# Patient Record
Sex: Male | Born: 1985 | Race: White | Hispanic: No | Smoking: Never smoker
Health system: Southern US, Community
[De-identification: ages and names within clinical notes are randomized; demographics above are authoritative.]

## PROBLEM LIST (undated history)

## (undated) DIAGNOSIS — N481 Balanitis: Secondary | ICD-10-CM

## (undated) DIAGNOSIS — T7840XA Allergy, unspecified, initial encounter: Secondary | ICD-10-CM

## (undated) DIAGNOSIS — T148XXA Other injury of unspecified body region, initial encounter: Secondary | ICD-10-CM

## (undated) DIAGNOSIS — J45909 Unspecified asthma, uncomplicated: Secondary | ICD-10-CM

## (undated) HISTORY — DX: Allergy, unspecified, initial encounter: T78.40XA

## (undated) HISTORY — DX: Other injury of unspecified body region, initial encounter: T14.8XXA

## (undated) HISTORY — DX: Balanitis: N48.1

## (undated) HISTORY — PX: TONSILLECTOMY: SUR1361

## (undated) HISTORY — DX: Unspecified asthma, uncomplicated: J45.909

---

## 2007-01-09 ENCOUNTER — Emergency Department (HOSPITAL_COMMUNITY): Admission: EM | Admit: 2007-01-09 | Discharge: 2007-01-09 | Payer: Self-pay | Admitting: Emergency Medicine

## 2012-02-25 ENCOUNTER — Ambulatory Visit (INDEPENDENT_AMBULATORY_CARE_PROVIDER_SITE_OTHER): Payer: PRIVATE HEALTH INSURANCE | Admitting: Family Medicine

## 2012-02-25 VITALS — BP 121/74 | HR 59 | Temp 99.0°F | Resp 16 | Ht 68.5 in | Wt 176.2 lb

## 2012-02-25 DIAGNOSIS — R197 Diarrhea, unspecified: Secondary | ICD-10-CM

## 2012-02-25 MED ORDER — METRONIDAZOLE 250 MG PO TABS: 250.0000 mg | ORAL_TABLET | Freq: Three times a day (TID) | ORAL | Status: AC

## 2012-02-25 NOTE — Progress Notes (Signed)
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 The diuresis as you have cramps. He has no blood in the stool and no fever. He's tried Pepto-Bismol without success.  Objective: HEENT-unremarkable  Chest: Clear her graft heart: Regular no murmur  Abdomen: Hyperactive bowel sounds, no HSM, no masses, nontender  Skin: No jaundice  Assessment: Community-acquired diarrhea  Plan: Flagyl 250 twice a day x5, culturelle twice a day x2 weeks

## 2012-02-25 NOTE — Patient Instructions (Signed)
Diarrhea Infections caused by germs (bacterial) or a virus commonly cause diarrhea. Your caregiver has determined that with time, rest and fluids, the diarrhea should improve. In general, eat normally while drinking more water than usual. Although water may prevent dehydration, it does not contain salt and minerals (electrolytes). Broths, weak tea without caffeine and oral rehydration solutions (ORS) replace fluids and electrolytes. Small amounts of fluids should be taken frequently. Large amounts at one time may not be tolerated. Plain water may be harmful in infants and the elderly. Oral rehydrating solutions (ORS) are available at pharmacies and grocery stores. ORS replace water and important electrolytes in proper proportions. Sports drinks are not as effective as ORS and may be harmful due to sugars worsening diarrhea.  ORS is especially recommended for use in children with diarrhea. As a general guideline for children, replace any new fluid losses from diarrhea and/or vomiting with ORS as follows:   If your child weighs 22 pounds or under (10 kg or less), give 60-120 mL ( -  cup or 2 - 4 ounces) of ORS for each episode of diarrheal stool or vomiting episode.   If your child weighs more than 22 pounds (more than 10 kgs), give 120-240 mL ( - 1 cup or 4 - 8 ounces) of ORS for each diarrheal stool or episode of vomiting.   While correcting for dehydration, children should eat normally. However, foods high in sugar should be avoided because this may worsen diarrhea. Large amounts of carbonated soft drinks, juice, gelatin desserts and other highly sugared drinks should be avoided.   After correction of dehydration, other liquids that are appealing to the child may be added. Children should drink small amounts of fluids frequently and fluids should be increased as tolerated. Children should drink enough fluids to keep urine clear or pale yellow.   Adults should eat normally while drinking more fluids  than usual. Drink small amounts of fluids frequently and increase as tolerated. Drink enough fluids to keep urine clear or pale yellow. Broths, weak decaffeinated tea, lemon lime soft drinks (allowed to go flat) and ORS replace fluids and electrolytes.   Avoid:   Carbonated drinks.   Juice.   Extremely hot or cold fluids.   Caffeine drinks.   Fatty, greasy foods.   Alcohol.   Tobacco.   Too much intake of anything at one time.   Gelatin desserts.   Probiotics are active cultures of beneficial bacteria. They may lessen the amount and number of diarrheal stools in adults. Probiotics can be found in yogurt with active cultures and in supplements.   Wash hands well to avoid spreading bacteria and virus.   Anti-diarrheal medications are not recommended for infants and children.   Only take over-the-counter or prescription medicines for pain, discomfort or fever as directed by your caregiver. Do not give aspirin to children because it may cause Reye's Syndrome.   For adults, ask your caregiver if you should continue all prescribed and over-the-counter medicines.   If your caregiver has given you a follow-up appointment, it is very important to keep that appointment. Not keeping the appointment could result in a chronic or permanent injury, and disability. If there is any problem keeping the appointment, you must call back to this facility for assistance.  SEEK IMMEDIATE MEDICAL CARE IF:   You or your child is unable to keep fluids down or other symptoms or problems become worse in spite of treatment.   Vomiting or diarrhea develops and becomes persistent.     There is vomiting of blood or bile (green material).   There is blood in the stool or the stools are black and tarry.   There is no urine output in 6-8 hours or there is only a small amount of very dark urine.   Abdominal pain develops, increases or localizes.   You have a fever.   Your baby is older than 3 months with a  rectal temperature of 102 F (38.9 C) or higher.   Your baby is 3 months old or younger with a rectal temperature of 100.4 F (38 C) or higher.   You or your child develops excessive weakness, dizziness, fainting or extreme thirst.   You or your child develops a rash, stiff neck, severe headache or become irritable or sleepy and difficult to awaken.  MAKE SURE YOU:   Understand these instructions.   Will watch your condition.   Will get help right away if you are not doing well or get worse.  Document Released: 11/02/2002 Document Revised: 11/01/2011 Document Reviewed: 09/19/2009 [employer] Patient Information 2012 [employer]. 

## 2013-02-20 ENCOUNTER — Ambulatory Visit (INDEPENDENT_AMBULATORY_CARE_PROVIDER_SITE_OTHER): Payer: BC Managed Care – PPO | Admitting: Internal Medicine

## 2013-02-20 ENCOUNTER — Encounter: Payer: Self-pay | Admitting: Internal Medicine

## 2013-02-20 VITALS — BP 110/72 | HR 65 | Temp 98.0°F | Ht 69.0 in | Wt 167.0 lb

## 2013-02-20 DIAGNOSIS — N481 Balanitis: Secondary | ICD-10-CM | POA: Insufficient documentation

## 2013-02-20 DIAGNOSIS — R21 Rash and other nonspecific skin eruption: Secondary | ICD-10-CM

## 2013-02-20 DIAGNOSIS — J45909 Unspecified asthma, uncomplicated: Secondary | ICD-10-CM

## 2013-02-20 HISTORY — DX: Balanitis: N48.1

## 2013-02-20 MED ORDER — NYSTATIN-TRIAMCINOLONE 100000-0.1 UNIT/GM-% EX CREA: TOPICAL_CREAM | Freq: Three times a day (TID) | CUTANEOUS | Status: DC

## 2013-02-20 MED ORDER — BECLOMETHASONE DIPROPIONATE 80 MCG/ACT IN AERS: 2.0000 | INHALATION_SPRAY | Freq: Two times a day (BID) | RESPIRATORY_TRACT | Status: DC

## 2013-02-20 NOTE — Patient Instructions (Addendum)
Avoid pets in your roow, avoid vacuuming  your room Next visit 3 months, please make an appointment

## 2013-02-20 NOTE — Assessment & Plan Note (Signed)
Rash likely fungal, see prescription

## 2013-02-20 NOTE — Assessment & Plan Note (Addendum)
Needs better control, see history of present illness Plan: Qvair Pet avoidance  Recheck on 4 months  Cont albuterol Consider PPIs

## 2013-02-20 NOTE — Progress Notes (Signed)
  Subjective:    Patient ID: Craig Thomas, male    DOB: [birth year], 27 y.o.   MRN: [id number]  HPI New patient,  Had a severe episode of asthma 07-2012, after that he decided to start running and lose weight which usually help his asthma. Currently doing better, still feeling short of breath and chest tightness in the mornings consequently takes albuterol every morning with good relief of symptoms. He also uses albuterol before running. Another asthma trigger is pet exposures, he does have a dog at home. Has a rash at the right leg for 1 month, area is itching but not bleeding or hurting.  Past Medical History  Diagnosis Date  . Asthma   . Allergic     pet and enviromental  . 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 Tonsillectomy      History   Social History  . Marital Status: Single    Spouse Name: N/A    Number of Children: 0  . Years of Education: N/A   Occupational History  . law student    Social History Main Topics  . Smoking status: Never Smoker   . Smokeless tobacco: Never Used  . Alcohol Use: Yes     Comment: socially   . Drug Use: No  . Sexually Active: Not on file   Other Topics Concern  . Not on file   Social History Narrative  . No narrative on file   Family History  Problem Relation Age of Onset  . Adopted: Yes  . Heart disease      GF   Review of Systems Denies GERD symptoms, no frequent cough or wheezing ( asthma symptom is mostly chest tightness ). Allergies recently well-controlled.    Objective:   Physical Exam  Skin:     General -- alert, well-developed  HEENT -- TMs normal, throat w/o redness, face symmetric and not tender to palpation, nose not congested  Lungs -- normal respiratory effort, no intercostal retractions, no accessory muscle use, and normal breath sounds.  Heart-- normal rate, regular rhythm, no murmur, and no gallop.    Neurologic-- alert &  oriented X3 and strength normal in all extremities. Psych-- Cognition and judgment appear intact. Alert and cooperative with normal attention span and concentration.  not anxious appearing and not depressed appearing.      Assessment & Plan:

## 2013-02-21 ENCOUNTER — Encounter: Payer: Self-pay | Admitting: Internal Medicine

## 2015-07-23 ENCOUNTER — Emergency Department (HOSPITAL_COMMUNITY): Payer: Self-pay

## 2015-07-23 ENCOUNTER — Encounter (HOSPITAL_COMMUNITY): Payer: Self-pay | Admitting: Emergency Medicine

## 2015-07-23 ENCOUNTER — Emergency Department (HOSPITAL_COMMUNITY)
Admission: EM | Admit: 2015-07-23 | Discharge: 2015-07-23 | Disposition: A | Discharge status: Home / Self Care | Payer: Self-pay | Attending: Emergency Medicine | Admitting: Emergency Medicine

## 2015-07-23 DIAGNOSIS — Z7951 Long term (current) use of inhaled steroids: Secondary | ICD-10-CM | POA: Insufficient documentation

## 2015-07-23 DIAGNOSIS — J45909 Unspecified asthma, uncomplicated: Secondary | ICD-10-CM | POA: Insufficient documentation

## 2015-07-23 DIAGNOSIS — S51812A Laceration without foreign body of left forearm, initial encounter: Secondary | ICD-10-CM | POA: Insufficient documentation

## 2015-07-23 DIAGNOSIS — Y288XXA Contact with other sharp object, undetermined intent, initial encounter: Secondary | ICD-10-CM | POA: Insufficient documentation

## 2015-07-23 DIAGNOSIS — S41112A Laceration without foreign body of left upper arm, initial encounter: Secondary | ICD-10-CM

## 2015-07-23 DIAGNOSIS — Y9289 Other specified places as the place of occurrence of the external cause: Secondary | ICD-10-CM | POA: Insufficient documentation

## 2015-07-23 DIAGNOSIS — Y998 Other external cause status: Secondary | ICD-10-CM | POA: Insufficient documentation

## 2015-07-23 DIAGNOSIS — Z79899 Other long term (current) drug therapy: Secondary | ICD-10-CM | POA: Insufficient documentation

## 2015-07-23 DIAGNOSIS — Y9389 Activity, other specified: Secondary | ICD-10-CM | POA: Insufficient documentation

## 2015-07-23 MED ORDER — LIDOCAINE-EPINEPHRINE 2 %-1:100000 IJ SOLN
20.0000 mL | Freq: Once | INTRAMUSCULAR | Status: AC
  Administered 2015-07-23: 20 mL via INTRADERMAL
  Filled 2015-07-23: qty 1

## 2015-07-23 MED ORDER — HYDROCODONE-ACETAMINOPHEN 5-325 MG PO TABS
1.0000 | ORAL_TABLET | Freq: Once | ORAL | Status: AC
  Administered 2015-07-23: 1 via ORAL
  Filled 2015-07-23: qty 1

## 2015-07-23 MED ORDER — BACITRACIN ZINC 500 UNIT/GM EX OINT
1.0000 "application " | TOPICAL_OINTMENT | Freq: Two times a day (BID) | CUTANEOUS | Status: DC
  Administered 2015-07-23: 1 via TOPICAL
  Filled 2015-07-23: qty 1.8

## 2015-07-23 NOTE — ED Provider Notes (Signed)
CSN: [id number]     Arrival date & time 07/23/15  0124 History   First MD Initiated Contact with Patient 07/23/15 [phone]     Chief Complaint  Patient presents with  . Extremity Laceration     (Consider location/radiation/quality/duration/timing/severity/associated sxs/prior Treatment) HPI Craig Thomas is a 29 y.o. male who comes in for evaluation of laceration. Patient states approximately 1 AM he put his left arm through a window during a zombie simulation. Nothing tried to improve symptoms. Reports sharp stinging pain with certain movements. Denies any numbness, weakness, decreased mobility. Last tetanus 1-1/2 years ago. No other modifying factors.  Past Medical History  Diagnosis Date  . Asthma   . Allergic     pet and enviromental  . 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 Tonsillectomy     Family History  Problem Relation Age of Onset  . Adopted: Yes  . Heart disease      GF   Social History  Substance Use Topics  . Smoking status: Never Smoker   . Smokeless tobacco: Never Used  . Alcohol Use: Yes     Comment: socially     Review of Systems A 10 point review of systems was completed and was negative except for pertinent positives and negatives as mentioned in the history of present illness     Allergies  Review of patient's allergies indicates no known allergies.  Home Medications   Prior to Admission medications   Medication Sig Start Date End Date Taking? Authorizing Provider  albuterol (PROVENTIL HFA;VENTOLIN HFA) 108 (90 BASE) MCG/ACT inhaler Inhale 2 puffs into the lungs every 6 (six) hours as needed for wheezing.   Yes Historical Provider, MD  beclomethasone (QVAR) 80 MCG/ACT inhaler Inhale 2 puffs into the lungs 2 (two) times daily. Patient taking differently: Inhale 1 puff into the lungs daily.  02/20/13  Yes Wanda Plump, MD  cetirizine (ZYRTEC) 10 MG tablet Take 10 mg by  mouth daily.   Yes Historical Provider, MD  ibuprofen (ADVIL,MOTRIN) 200 MG tablet Take 400 mg by mouth every 6 (six) hours as needed for moderate pain.   Yes Historical Provider, MD  Multiple Vitamin (MULTIVITAMIN WITH MINERALS) TABS tablet Take 1 tablet by mouth daily.   Yes Historical Provider, MD  nystatin-triamcinolone (MYCOLOG II) cream Apply topically 3 (three) times daily. Patient not taking: Reported on 07/23/2015 02/20/13   Wanda Plump, MD   BP 121/61 mmHg  Pulse 88  Temp(Src) 99.3 F (37.4 C) (Oral)  Resp 16  SpO2 93% Physical Exam  Constitutional:  Awake, alert, nontoxic appearance.  HENT:  Head: Atraumatic.  Eyes: Right eye exhibits no discharge. Left eye exhibits no discharge.  Neck: Neck supple.  Pulmonary/Chest: Effort normal. He exhibits no tenderness.  Abdominal: Soft. There is no tenderness. There is no rebound.  Musculoskeletal: He exhibits no tenderness.  Baseline ROM, no obvious new focal weakness. Maintains full active range of motion of left elbow, wrist and all digits. No evidence of tendon involvement.  Neurological:  Mental status and motor strength appears baseline for patient and situation. Sensation intact to light touch  Skin: No rash noted.  3 small, linear lacerations noted to the left mid forearm ranging from 0.5 cm to 2 cm. One small, linear laceration to volar aspect of left wrist, 1 cm. Bottom of all wounds explored.  Psychiatric: He has a normal mood  and affect.  Nursing note and vitals reviewed.   ED Course  Procedures (including critical care time) LACERATION REPAIR Performed by: Sharlene Motts Authorized by: Sharlene Motts Consent: Verbal consent obtained. Risks and benefits: risks, benefits and alternatives were discussed Consent given by: patient Patient identity confirmed: provided demographic data Prepped and Draped in normal sterile fashion Wound explored  Laceration Location: L forearm   Laceration Length: 2 cm  No  Foreign Bodies seen or palpated  Anesthesia: local infiltration  Local anesthetic: lidocaine 2% w epinephrine  Anesthetic total: 2 ml  Irrigation method: syringe Amount of cleaning: standard  Skin closure: 4-0 Prolene  Number of sutures: 4  Technique: Simple Ineterrupted  Patient tolerance: Patient tolerated the procedure well with no immediate complications.   LACERATION REPAIR Performed by: Sharlene Motts Authorized by: Sharlene Motts Consent: Verbal consent obtained. Risks and benefits: risks, benefits and alternatives were discussed Consent given by: patient Patient identity confirmed: provided demographic data Prepped and Draped in normal sterile fashion Wound explored  Laceration Location: L forearm (x2)   Laceration Length: 2 cm  No Foreign Bodies seen or palpated  Anesthesia: local infiltration  Local anesthetic: lidocaine 2% w epinephrine  Anesthetic total: 2 ml  Irrigation method: syringe Amount of cleaning: standard  Skin closure: 4-0 Prolene  Number of sutures: 4  Technique: Simple Ineterrupted  Patient tolerance: Patient tolerated the procedure well with no immediate complications.   LACERATION REPAIR Performed by: Sharlene Motts Authorized by: Sharlene Motts Consent: Verbal consent obtained. Risks and benefits: risks, benefits and alternatives were discussed Consent given by: patient Patient identity confirmed: provided demographic data Prepped and Draped in normal sterile fashion Wound explored  Laceration Location: L wrist   Laceration Length: 1 cm  No Foreign Bodies seen or palpated  Anesthesia: local infiltration  Local anesthetic: lidocaine 2% w epinephrine  Anesthetic total: 2 ml  Irrigation method: syringe Amount of cleaning: standard  Skin closure: 4-0 Prolene  Number of sutures: 2  Technique: Simple Ineterrupted  Patient tolerance: Patient tolerated the procedure well with no immediate  complications.  LACERATION REPAIR Performed by: Sharlene Motts Authorized by: Sharlene Motts Consent: Verbal consent obtained. Risks and benefits: risks, benefits and alternatives were discussed Consent given by: patient Patient identity confirmed: provided demographic data Prepped and Draped in normal sterile fashion Wound explored  Laceration Location: L forearm  x3   Laceration Length: 2 cm  No Foreign Bodies seen or palpated  Anesthesia: local infiltration  Local anesthetic: lidocaine 2% w epinephrine  Anesthetic total: 2 ml  Irrigation method: syringe Amount of cleaning: standard  Skin closure: 4-0 Prolene  Number of sutures: 4  Technique: Simple Ineterrupted  Patient tolerance: Patient tolerated the procedure well with no immediate complications.   Labs Review Labs Reviewed - No data to display  Imaging Review Dg Forearm Left  07/23/2015   CLINICAL DATA:  Multiple left arm lacerations and left wrist laceration.  EXAM: LEFT FOREARM - 2 VIEW  COMPARISON:  None.  FINDINGS: There is no evidence of fracture or other focal bone lesions. Skin defects consistent with lacerations on the volar surface of the forearm. No radiopaque soft tissue foreign bodies.  IMPRESSION: Skin lacerations. No radiopaque soft tissue foreign bodies. No acute bony abnormalities.   Electronically Signed   By: Burman Nieves M.D.   On: 07/23/2015 02:21   Dg Wrist Complete Left  07/23/2015   CLINICAL DATA:  Left arm and left wrist lacerations. Patient  put arm through a window.  EXAM: LEFT WRIST - COMPLETE 3+ VIEW  COMPARISON:  None.  FINDINGS: There is no evidence of fracture or dislocation. There is no evidence of arthropathy or other focal bone abnormality. Soft tissues are unremarkable. No radiopaque soft tissue foreign bodies.  IMPRESSION: Negative.   Electronically Signed   By: Burman Nieves M.D.   On: 07/23/2015 02:22   I have personally reviewed and evaluated these images and  lab results as part of my medical decision-making.   EKG Interpretation None     Meds given in ED:  Medications  bacitracin ointment 1 application (not administered)  HYDROcodone-acetaminophen (NORCO/VICODIN) 5-325 MG per tablet 1 tablet (1 tablet Oral Given 07/23/15 2130)  lidocaine-EPINEPHrine (XYLOCAINE W/EPI) 2 %-1:100000 (with pres) injection 20 mL (20 mLs Intradermal Given 07/23/15 0633)    New Prescriptions   No medications on file   Filed Vitals:   07/23/15 0130 07/23/15 0645  BP: 148/86 121/61  Pulse: 114 88  Temp: 98.8 F (37.1 C) 99.3 F (37.4 C)  TempSrc: Oral Oral  Resp: 18 16  SpO2: 98% 93%    MDM  Vitals stable - WNL -afebrile Pt resting comfortably in ED. PE--physical exam as above, multiple lacerations. No bony or tendon involvement. Patient maintains full active range of motion. Neurovascularly intact. Labwork--tetanus update and 1.5 years ago. Imaging--plain films of the left forearm and wrist are negative for any fracture, foreign body or dislocation. Laceration repaired at bedside by myself, discussed laceration care and hygiene at home. Discussed Motrin and Tylenol for discomfort. Follow-up with PCP in 10 days for suture removal, sooner if any evidence of infection. I discussed all relevant lab findings and imaging results with pt and they verbalized understanding. Discussed f/u with PCP within 48 hrs and return precautions, pt very amenable to plan.  Final diagnoses:  Lacerations of multiple sites of left arm, initial encounter        Joycie Peek, PA-C 07/23/15 [id number]  Marisa Severin, MD 07/23/15 2101

## 2015-07-23 NOTE — Discharge Instructions (Signed)
Please keep your sutures clean and dry. Follow-up with your doctor in 10 days for suture removal. Return to ED for reevaluation if you experience any increased redness or swelling, cloudy drainage or other signs of infection. You may take Tylenol or Motrin as needed for discomfort.  Laceration Care, Adult A laceration is a cut or lesion that goes through all layers of the skin and into the tissue just beneath the skin. TREATMENT  Some lacerations may not require closure. Some lacerations may not be able to be closed due to an increased risk of infection. It is important to see your caregiver as soon as possible after an injury to minimize the risk of infection and maximize the opportunity for successful closure. If closure is appropriate, pain medicines may be given, if needed. The wound will be cleaned to help prevent infection. Your caregiver will use stitches (sutures), staples, wound glue (adhesive), or skin adhesive strips to repair the laceration. These tools bring the skin edges together to allow for faster healing and a better cosmetic outcome. However, all wounds will heal with a scar. Once the wound has healed, scarring can be minimized by covering the wound with sunscreen during the day for 1 full year. HOME CARE INSTRUCTIONS  For sutures or staples:  Keep the wound clean and dry.  If you were given a bandage (dressing), you should change it at least once a day. Also, change the dressing if it becomes wet or dirty, or as directed by your caregiver.  Wash the wound with soap and water 2 times a day. Rinse the wound off with water to remove all soap. Pat the wound dry with a clean towel.  After cleaning, apply a thin layer of the antibiotic ointment as recommended by your caregiver. This will help prevent infection and keep the dressing from sticking.  You may shower as usual after the first 24 hours. Do not soak the wound in water until the sutures are removed.  Only take  over-the-counter or prescription medicines for pain, discomfort, or fever as directed by your caregiver.  Get your sutures or staples removed as directed by your caregiver. For skin adhesive strips:  Keep the wound clean and dry.  Do not get the skin adhesive strips wet. You may bathe carefully, using caution to keep the wound dry.  If the wound gets wet, pat it dry with a clean towel.  Skin adhesive strips will fall off on their own. You may trim the strips as the wound heals. Do not remove skin adhesive strips that are still stuck to the wound. They will fall off in time. For wound adhesive:  You may briefly wet your wound in the shower or bath. Do not soak or scrub the wound. Do not swim. Avoid periods of heavy perspiration until the skin adhesive has fallen off on its own. After showering or bathing, gently pat the wound dry with a clean towel.  Do not apply liquid medicine, cream medicine, or ointment medicine to your wound while the skin adhesive is in place. This may loosen the film before your wound is healed.  If a dressing is placed over the wound, be careful not to apply tape directly over the skin adhesive. This may cause the adhesive to be pulled off before the wound is healed.  Avoid prolonged exposure to sunlight or tanning lamps while the skin adhesive is in place. Exposure to ultraviolet light in the first year will darken the scar.  The skin adhesive  will usually remain in place for 5 to 10 days, then naturally fall off the skin. Do not pick at the adhesive film. You may need a tetanus shot if:  You cannot remember when you had your last tetanus shot.  You have never had a tetanus shot. If you get a tetanus shot, your arm may swell, get red, and feel warm to the touch. This is common and not a problem. If you need a tetanus shot and you choose not to have one, there is a rare chance of getting tetanus. Sickness from tetanus can be serious. SEEK MEDICAL CARE IF:   You  have redness, swelling, or increasing pain in the wound.  You see a red line that goes away from the wound.  You have yellowish-white fluid (pus) coming from the wound.  You have a fever.  You notice a bad smell coming from the wound or dressing.  Your wound breaks open before or after sutures have been removed.  You notice something coming out of the wound such as wood or glass.  Your wound is on your hand or foot and you cannot move a finger or toe. SEEK IMMEDIATE MEDICAL CARE IF:   Your pain is not controlled with prescribed medicine.  You have severe swelling around the wound causing pain and numbness or a change in color in your arm, hand, leg, or foot.  Your wound splits open and starts bleeding.  You have worsening numbness, weakness, or loss of function of any joint around or beyond the wound.  You develop painful lumps near the wound or on the skin anywhere on your body. MAKE SURE YOU:   Understand these instructions.  Will watch your condition.  Will get help right away if you are not doing well or get worse. Document Released: 11/12/2005 Document Revised: 02/04/2012 Document Reviewed: 05/08/2011 [facility] Patient Information 2015 [place], [place]. This information is not intended to replace advice given to you by your health care provider. Make sure you discuss any questions you have with your health care provider.

## 2015-07-23 NOTE — ED Notes (Addendum)
Pt c/o  Several left arm lacerations and a left wrist laceration. 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 Pt is dressed as a zombie.

## 2015-07-23 NOTE — ED Notes (Signed)
Bed: WA07 Expected date:  Expected time:  Means of arrival:  Comments: [place]

## 2017-01-04 ENCOUNTER — Ambulatory Visit (INDEPENDENT_AMBULATORY_CARE_PROVIDER_SITE_OTHER): Payer: Self-pay | Admitting: Family Medicine

## 2017-01-04 VITALS — BP 120/74 | HR 82 | Temp 98.1°F | Ht 69.0 in | Wt 161.6 lb

## 2017-01-04 DIAGNOSIS — R1032 Left lower quadrant pain: Secondary | ICD-10-CM

## 2017-01-04 DIAGNOSIS — Z113 Encounter for screening for infections with a predominantly sexual mode of transmission: Secondary | ICD-10-CM

## 2017-01-04 MED ORDER — DOXYCYCLINE HYCLATE 100 MG PO TABS
100.0000 mg | ORAL_TABLET | Freq: Two times a day (BID) | ORAL | 0 refills | Status: DC
Start: 2017-01-04 — End: 2017-01-16

## 2017-01-04 MED ORDER — CEFTRIAXONE SODIUM 250 MG IJ SOLR
250.0000 mg | Freq: Once | INTRAMUSCULAR | Status: AC
  Administered 2017-01-04: 250 mg via INTRAMUSCULAR

## 2017-01-04 NOTE — Patient Instructions (Addendum)
     IF you received an x-ray today, you will receive an invoice from [facility]. Please contact [facility] at [phone] with questions or concerns regarding your invoice.   IF you received labwork today, you will receive an invoice from [facility]. Please contact [facility] at [phone] with questions or concerns regarding your invoice.   Our billing staff will not be able to assist you with questions regarding bills from these companies.  You will be contacted with the lab results as soon as they are available. The fastest way to get your results is to activate your My Chart account. Instructions are located on the last page of this paperwork. If you have not heard from us regarding the results in 2 weeks, please contact this office.     Testicular Self-Exam A self-examination of your testicles involves looking at and feeling your testicles for abnormal lumps or swelling. Several things can cause swelling, lumps, or pain in your testicles. Some of these causes are:  Injuries.  Inflammation.  Infection.  Accumulation of fluids around your testicle (hydrocele).  Twisted testicles (testicular torsion).  Testicular cancer. Self-examination of the testicles and groin areas may be advised if you are at risk for testicular cancer. Risks for testicular cancer include:  An undescended testicle (cryptorchidism).  A history of previous testicular cancer.  A family history of testicular cancer. The testicles are easiest to examine after warm baths or showers and are more difficult to examine when you are cold. This is because the muscles attached to the testicles retract and pull them up higher or into the abdomen. Follow these steps while you are standing:  Hold your penis away from your body.  Roll one testicle between your thumb and forefinger, feeling the entire testicle.  Roll the other testicle between your thumb and forefinger, feeling the entire  testicle. Feel for lumps, swelling, or discomfort. A normal testicle is egg shaped and feels firm. It is smooth and not tender. The spermatic cord can be felt as a firm spaghetti-like cord at the back of your testicle. It is also important to examine the crease between the front of your leg and your abdomen. Feel for any bumps that are tender. These could be enlarged lymph nodes.  This information is not intended to replace advice given to you by your health care provider. Make sure you discuss any questions you have with your health care provider. Document Released: 02/18/2001 Document Revised: 07/15/2013 Document Reviewed: 05/04/2013 Elsevier Interactive Patient Education  2017 [employer]

## 2017-01-04 NOTE — Progress Notes (Signed)
Chief Complaint  Patient presents with  . Groin Pain    X 2-3 days    HPI   Pt reports that 2 weeks ago he had intercourse with a new partner The next day he had a sore spot on his penis His sexual partner reported that she had a yeast infection and so he started having improvement in symptoms In the past 2-3 days he has noted some squeezing pain in his left testicle  And is beginning to occur in the right testicle as well No discharge No dysuria No pain with intercourse  No pain with defecation  He would rate his pain as a 3/10 It can disturb his sleep when it is present but does not stop him from going about his day  About 10 years ago he was treated for tesicular infection with penicillin    Past Medical History:  Diagnosis Date  . Allergic    pet and enviromental  . Asthma   . 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 albuterol (PROVENTIL HFA;VENTOLIN HFA) 108 (90 BASE) MCG/ACT inhaler Inhale 2 puffs into the lungs every 6 (six) hours as needed for wheezing.    . beclomethasone (QVAR) 80 MCG/ACT inhaler Inhale 2 puffs into the lungs 2 (two) times daily. (Patient taking differently: Inhale 1 puff into the lungs daily. ) 1 Inhaler 6  . cetirizine (ZYRTEC) 10 MG tablet Take 10 mg by mouth daily.    Marland Kitchen ibuprofen (ADVIL,MOTRIN) 200 MG tablet Take 400 mg by mouth every 6 (six) hours as needed for moderate pain.    . Multiple Vitamin (MULTIVITAMIN WITH MINERALS) TABS tablet Take 1 tablet by mouth daily.    Marland Kitchen nystatin-triamcinolone (MYCOLOG II) cream Apply topically 3 (three) times daily. 30 g 0   No current facility-administered medications for this visit.     Allergies: No Known Allergies  Past Surgical History:  Procedure Laterality Date  . TONSILLECTOMY      Social History   Social History  . Marital status: Married    Spouse name: N/A  . Number of children: 0  . Years  of education: N/A   Occupational History  . Network engineer   Social History Main Topics  . Smoking status: Never Smoker  . Smokeless tobacco: Never Used  . Alcohol use Yes     Comment: socially   . Drug use: No  . Sexual activity: Not Asked   Other Topics Concern  . None   Social History Narrative  . None    ROS See hpi  Objective: Vitals:   01/04/17 1319  BP: 120/74  Pulse: 82  Temp: 98.1 F (36.7 C)  TempSrc: Oral  SpO2: 100%  Weight: 161 lb 9.6 oz (73.3 kg)  Height: 5\' 9"  (1.753 m)    Physical Exam  Constitutional: He is oriented to person, place, and time. He appears well-developed and well-nourished.  HENT:  Head: Normocephalic and atraumatic.  Abdominal: Hernia confirmed negative in the right inguinal area and confirmed negative in the left inguinal area.  Genitourinary: Testes normal. Cremasteric reflex is present. Right testis shows no mass, no swelling and no tenderness. Right testis is descended. Cremasteric reflex is not absent on the right side. Left testis shows no mass, no swelling and no tenderness. Left testis is descended. Cremasteric reflex is not absent on the left side. Circumcised. Penile erythema present. No  phimosis or penile tenderness. No discharge found.  Lymphadenopathy: No inguinal adenopathy noted on the right or left side.  Neurological: He is alert and oriented to person, place, and time.    Assessment and Plan [name] was seen today for groin pain.  Diagnoses and all orders for this visit:  Left groin pain Screen for STD (sexually transmitted disease) -     GC/Chlamydia Probe Amp -     Hepatitis B surface antibody -     HIV antibody -     RPR   Empiric treatment for epididymitis to cover GC and chlamydia No palpable hernia in the inguinal canal Non tender testes on exam Advised pt continue topical lotrimin Sent off std screening labs Will check Ultrasound of the groin    Marcellene Shivley A Creta LevinStallings

## 2017-01-05 LAB — RPR: RPR: NONREACTIVE

## 2017-01-05 LAB — HEPATITIS B SURFACE ANTIBODY, QUANTITATIVE

## 2017-01-05 LAB — HIV ANTIBODY (ROUTINE TESTING W REFLEX): HIV SCREEN 4TH GENERATION: NONREACTIVE

## 2017-01-06 LAB — GC/CHLAMYDIA PROBE AMP
CHLAMYDIA, DNA PROBE: NEGATIVE
NEISSERIA GONORRHOEAE BY PCR: NEGATIVE

## 2017-01-07 ENCOUNTER — Ambulatory Visit (INDEPENDENT_AMBULATORY_CARE_PROVIDER_SITE_OTHER): Payer: Self-pay | Admitting: Physician Assistant

## 2017-01-07 ENCOUNTER — Encounter: Payer: Self-pay | Admitting: Family Medicine

## 2017-01-07 VITALS — BP 118/78 | HR 72 | Temp 99.3°F

## 2017-01-07 DIAGNOSIS — M25551 Pain in right hip: Secondary | ICD-10-CM

## 2017-01-07 DIAGNOSIS — R1032 Left lower quadrant pain: Secondary | ICD-10-CM

## 2017-01-07 DIAGNOSIS — R21 Rash and other nonspecific skin eruption: Secondary | ICD-10-CM

## 2017-01-07 LAB — GLUCOSE, POCT (MANUAL RESULT ENTRY): POC Glucose: 83 mg/dl (ref 70–99)

## 2017-01-07 MED ORDER — KETOCONAZOLE 2 % EX CREA: TOPICAL_CREAM | CUTANEOUS | 0 refills | Status: DC

## 2017-01-07 NOTE — Patient Instructions (Addendum)
Stop the doxycycline. If the ultrasound indicates it needs to be resumed, we will advise you to restart it. If the testicular pain worsens, please resume it. Use the cream twice a day until the rash/burning is resolved, then an additional 4-5 days. Use the cream for at least 7-10 days.  If the hip and groin pain persist once this issue is resolved, please return here or see your PCP for additional evaluation.    IF you received an x-ray today, you will receive an invoice from [facility]. Please contact [facility] at [phone] with questions or concerns regarding your invoice.   IF you received labwork today, you will receive an invoice from [facility]. Please contact [facility] at [phone] with questions or concerns regarding your invoice.   Our billing staff will not be able to assist you with questions regarding bills from these companies.  You will be contacted with the lab results as soon as they are available. The fastest way to get your results is to activate your My Chart account. Instructions are located on the last page of this paperwork. If you have not heard from us regarding the results in 2 weeks, please contact this office.

## 2017-01-07 NOTE — Progress Notes (Signed)
Patient ID: Craig Thomas, male    DOB: Nov 24, 1986, 31 y.o.   MRN: [id number]  PCP: Willow Ora, MD  Chief Complaint  Patient presents with  . Follow-up    penis inflammation/worse and right hip pain    Subjective:   Presents for evaluation of penile rash and right hip pain.  Pt is a 31 yo caucasian male who presents with worsening rash of his penis and new onset right hip pain. Pt was seen on 01/04/2017 for a "sore place" on his penis x 2 weeks and new onset left testicular pain. At that time, he was tested for STDs and treated empirically for epididymitis with Doxycycline and told to continue use of OTC Lotrimin cream. Scrotal ultrasound was ordered, but has not been completed yet. RPR, GC, chlamydia, HIV, and Hep B were all negative.   Today, pt states that day before yesterday, the sore on his penis doubled in size and he developed right hip soreness and mild lower back ache. Denies trauma. Yesterday, he switched from Lotrimin to Monistat, has helped with the burning.   Admits to subjective fever and itchy patches of skin on his lower legs that get worse in the cold. Denies fatigue, cough, SOB, chest pain, palpitations, dysuria, urinary frequency or urgency, abdominal pain, nausea, vomiting, diarrhea, or constipation.  No personal history of diabetes, adopted. Has not had labs drawn in about 8 years.  Review of Systems See HPI    Patient Active Problem List   Diagnosis Date Noted  . Rash and nonspecific skin eruption 02/20/2013  . Asthma      Prior to Admission medications   Medication Sig Start Date End Date Taking? Authorizing Provider  albuterol (PROVENTIL HFA;VENTOLIN HFA) 108 (90 BASE) MCG/ACT inhaler Inhale 2 puffs into the lungs every 6 (six) hours as needed for wheezing.   Yes Historical Provider, MD  beclomethasone (QVAR) 80 MCG/ACT inhaler Inhale 2 puffs into the lungs 2 (two) times daily. Patient taking differently: Inhale 1 puff into the lungs daily.  02/20/13   Yes Wanda Plump, MD  cetirizine (ZYRTEC) 10 MG tablet Take 10 mg by mouth daily.   Yes Historical Provider, MD  doxycycline (VIBRA-TABS) 100 MG tablet Take 1 tablet (100 mg total) by mouth 2 (two) times daily. 01/04/17  Yes Doristine Bosworth, MD  ibuprofen (ADVIL,MOTRIN) 200 MG tablet Take 400 mg by mouth every 6 (six) hours as needed for moderate pain.   Yes Historical Provider, MD  Multiple Vitamin (MULTIVITAMIN WITH MINERALS) TABS tablet Take 1 tablet by mouth daily.   Yes Historical Provider, MD  OVER THE COUNTER MEDICATION monostat cream   Yes Historical Provider, MD  nystatin-triamcinolone (MYCOLOG II) cream Apply topically 3 (three) times daily. Patient not taking: Reported on 01/07/2017 02/20/13   Wanda Plump, MD     No Known Allergies     Objective:  Physical Exam HEENT: PERRLA. No lymphadenopathy, no tenderness to palpation of neck. Pulm: Good respiratory effort. CTAB. No wheezes, rales, or rhonchi. CV: RRR. No M/R/G.  Abd: Soft, non-tender, non-distended. + BS x 4 quadrants. Negative CVA tenderness. MSK: Full ROM at hips and knees bilaterally without pain. Hip and back are non-tender to palpation. GU: Uncircumcised external genitalia. Area of erythema just proximal to glans on dorsal surface of penis, circumventing ~4 cm in a counterclockwise direction. No exudate. Skin intact and dry. Mild tenderness to palpation of distal penis. Testicles are non-tender to palpation bilaterally.  Assessment & Plan:   1. Penile rash Possibly due to yeast. Will treat with topical Ketoconazole.  - POCT glucose (manual entry) - ketoconazole (NIZORAL) 2 % cream; Apply liberally to affected area twice a day until the inflammation is resolved and then for 4 more days.  Dispense: 30 g; Refill: 0  2. Left groin pain Pt advised to wait for contact from imaging facility and to undergo ultrasound of his testicles. Pt advised to discontinue doxycycline for now because he believes it is making his rash  worse. Pt advised to restart the Doxycycline and to make an appointment to be seen if he has worsening testicular pain.  - POCT glucose (manual entry)  3. Right hip pain Exam seems benign. May be due to changes in gait brought on by the pain/irritation of his penis.  Georgiana SpinnerHannah Bradley Laela Deviney, PA-S

## 2017-01-07 NOTE — Progress Notes (Signed)
Patient ID: Craig Thomas, male    DOB: January 28, 1986, 31 y.o.   MRN: [id number]  PCP: Willow Ora, MD  Chief Complaint  Patient presents with  . Follow-up    penis inflammation/worse and right hip pain    Subjective:   Presents for evaluation of testicular pain and worsening "inflammation" of the penis.  He was seen here 01/04/2017 with 2 weeks of a "sore place" on the penile shaft and LEFT testicular pain. He was treated empirically for epididymitis with doxycycline. GC/CT, HIV, RPR and Heb B results were ultimately negative. Lotrimin cream recommended for the area of erythema on the penile shaft. A testicular US ordered has not yet been scheduled.  He notes that the sore place developed following unprotected intercourse with his girlfriend, who has since developed vaginal candidiasis.  He reports that 2 days ago the lesion on the penile shaft doubled in size, and he has developed soreness in the RIGHT hip and mild lower back ache. The burning sensation of the lesion improved with a change from Lotrimin to Monistat.   Review of Systems As above.    Patient Active Problem List   Diagnosis Date Noted  . Rash and nonspecific skin eruption 02/20/2013  . Asthma     No Known Allergies  Prior to Admission medications   Medication Sig Start Date End Date Taking? Authorizing Provider  albuterol (PROVENTIL HFA;VENTOLIN HFA) 108 (90 BASE) MCG/ACT inhaler Inhale 2 puffs into the lungs every 6 (six) hours as needed for wheezing.   Yes Historical Provider, MD  beclomethasone (QVAR) 80 MCG/ACT inhaler Inhale 2 puffs into the lungs 2 (two) times daily. Patient taking differently: Inhale 1 puff into the lungs daily.  02/20/13  Yes Wanda Plump, MD  cetirizine (ZYRTEC) 10 MG tablet Take 10 mg by mouth daily.   Yes Historical Provider, MD  doxycycline (VIBRA-TABS) 100 MG tablet Take 1 tablet (100 mg total) by mouth 2 (two) times daily. 01/04/17  Yes Doristine Bosworth, MD  ibuprofen (ADVIL,MOTRIN) 200  MG tablet Take 400 mg by mouth every 6 (six) hours as needed for moderate pain.   Yes Historical Provider, MD  Multiple Vitamin (MULTIVITAMIN WITH MINERALS) TABS tablet Take 1 tablet by mouth daily.   Yes Historical Provider, MD  OVER THE COUNTER MEDICATION monostat cream   Yes Historical Provider, MD  nystatin-triamcinolone (MYCOLOG II) cream Apply topically 3 (three) times daily. Patient not taking: Reported on 01/07/2017 02/20/13   Wanda Plump, MD     Past Medical, Surgical Family and Social History reviewed and updated.        Objective:  Physical Exam  Constitutional: He is oriented to person, place, and time. He appears well-developed and well-nourished. He is active and cooperative. No distress.  BP 118/78 (Cuff Size: Normal)   Pulse 72   Temp 99.3 F (37.4 C) (Oral)   SpO2 100%    Eyes: Conjunctivae are normal.  Cardiovascular: Normal rate.   Pulmonary/Chest: Effort normal.  Abdominal: Hernia confirmed negative in the right inguinal area and confirmed negative in the left inguinal area.  Genitourinary: Testes normal. Right testis shows no mass, no swelling and no tenderness. Right testis is descended. Cremasteric reflex is not absent on the right side. Left testis shows no mass, no swelling and no tenderness. Left testis is descended. Cremasteric reflex is not absent on the left side. Circumcised. No phimosis, paraphimosis, hypospadias or penile tenderness. Penile erythema: scant erythema in a patch on the RIGHT distal shaft,  adjacent to the corona of the glans. No discharge found.  Lymphadenopathy:       Right: No inguinal adenopathy present.       Left: No inguinal adenopathy present.  Neurological: He is alert and oriented to person, place, and time.  Psychiatric: He has a normal mood and affect. His speech is normal and behavior is normal.     Results for orders placed or performed in visit on 01/07/17  POCT glucose (manual entry)  Result Value Ref Range   POC Glucose  83 70 - 99 mg/dl        Assessment & Plan:  1. Penile rash Possibly worsening with the addition of Doxycycline. Given negative STI tests, and pending US, elect to stop it for now. Topical prescription antifungal. - POCT glucose (manual entry) - ketoconazole (NIZORAL) 2 % cream; Apply liberally to affected area twice a day until the inflammation is resolved and then for 4 more days.  Dispense: 30 g; Refill: 0  2. Left groin pain 3. Right hip pain The patient associates these new symptoms with the testicular pain and burning of the penis. Await US results. If persist, will re-evaluate.   Fernande Brashelle S. Elfreida Heggs, PA-C Physician Assistant-Certified [facility]

## 2017-01-08 ENCOUNTER — Emergency Department (HOSPITAL_COMMUNITY)
Admission: EM | Admit: 2017-01-08 | Discharge: 2017-01-08 | Disposition: A | Discharge status: Home / Self Care | Payer: Self-pay | Attending: Emergency Medicine | Admitting: Emergency Medicine

## 2017-01-08 ENCOUNTER — Encounter (HOSPITAL_COMMUNITY): Payer: Self-pay | Admitting: Emergency Medicine

## 2017-01-08 DIAGNOSIS — M792 Neuralgia and neuritis, unspecified: Secondary | ICD-10-CM | POA: Insufficient documentation

## 2017-01-08 DIAGNOSIS — M545 Low back pain: Secondary | ICD-10-CM | POA: Insufficient documentation

## 2017-01-08 DIAGNOSIS — J45909 Unspecified asthma, uncomplicated: Secondary | ICD-10-CM | POA: Insufficient documentation

## 2017-01-08 MED ORDER — PREDNISONE 10 MG PO TABS: 40.0000 mg | ORAL_TABLET | Freq: Every day | ORAL | 0 refills | Status: AC

## 2017-01-08 MED ORDER — GABAPENTIN 300 MG PO CAPS: 300.0000 mg | ORAL_CAPSULE | Freq: Every day | ORAL | 0 refills | Status: DC

## 2017-01-08 MED ORDER — CYCLOBENZAPRINE HCL 10 MG PO TABS: 10.0000 mg | ORAL_TABLET | Freq: Three times a day (TID) | ORAL | 0 refills | Status: DC | PRN

## 2017-01-08 MED ORDER — NAPROXEN 500 MG PO TABS: 500.0000 mg | ORAL_TABLET | Freq: Two times a day (BID) | ORAL | 0 refills | Status: DC

## 2017-01-08 NOTE — ED Triage Notes (Signed)
Pt here for lower back pain into groin and testicles x 4 days

## 2017-01-08 NOTE — ED Provider Notes (Signed)
[facility]-EMERGENCY DEPT Provider Note   CSN: [id number] Arrival date & time: 01/08/17  1544  By signing my name below, I, Craig Thomas, attest that this documentation has been prepared under the direction and in the presence of [name], MD. Electronically Signed: Sonum Thomas, Neurosurgeon. 01/08/17. 7:32 PM.  History   Chief Complaint Chief Complaint  Patient presents with  . Testicle Pain  . Back Pain    The history is provided by the patient. No language interpreter was used.     HPI Comments: Craig Thomas is a 31 y.o. male who presents to the Emergency Department complaining of 3 weeks of bilateral testicular pain, left worse than right. He describes the pain as sharp/shooting as well as squeezing at times. He reports associated lower back pain with radiation to the bilateral buttocks and upper thighs, left worse than right. He states the pain is worse throughout the day as well as with sitting and lying in a supine position. He denies recent falls or trauma to the affected area. He has taken Motrin, Tylenol, and Advil without relief. He states the pain worsened today after running. He notes a history of similar symptoms 12 years ago and was seen in the ED, UC, and a urologist without any clear answer. He reports having ultrasounds of his testicles in the past but denies ever having back imaging completed. In the past he was prescribed gabapentin. Since then he has felt twinges of pain intermittently over the years during airplane flights or long car rides. He denies weakness, numbness, bowel/bladder incontinence, fever, abdominal pain, hematuria, urinary frequency, dysuria. He denies IV drug use.   He also notes a painful red spot to the side of his penis for the past 3 weeks. He was seen at the [facility] and was prescribed doxycycline and an antifungal cream; states the cream has helped alleviate the pain and skin changes.   Past Medical History:  Diagnosis Date  . Allergic    pet and  enviromental  . Asthma   . 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 Rash and nonspecific skin eruption 02/20/2013  . Asthma     Past Surgical History:  Procedure Laterality Date  . TONSILLECTOMY         Home Medications    Prior to Admission medications   Medication Sig Start Date End Date Taking? Authorizing Provider  albuterol (PROVENTIL HFA;VENTOLIN HFA) 108 (90 BASE) MCG/ACT inhaler Inhale 2 puffs into the lungs every 6 (six) hours as needed for wheezing.    Historical Provider, MD  beclomethasone (QVAR) 80 MCG/ACT inhaler Inhale 2 puffs into the lungs 2 (two) times daily. Patient taking differently: Inhale 1 puff into the lungs daily.  02/20/13   Wanda Plump, MD  cetirizine (ZYRTEC) 10 MG tablet Take 10 mg by mouth daily.    Historical Provider, MD  cyclobenzaprine (FLEXERIL) 10 MG tablet Take 1 tablet (10 mg total) by mouth 3 (three) times daily as needed for muscle spasms. 01/08/17   [name], MD  doxycycline (VIBRA-TABS) 100 MG tablet Take 1 tablet (100 mg total) by mouth 2 (two) times daily. 01/04/17   Doristine Bosworth, MD  gabapentin (NEURONTIN) 300 MG capsule Take 1 capsule (300 mg total) by mouth at bedtime. Frequency may be increased over time with physician direction. 01/08/17   [name], MD  ibuprofen (ADVIL,MOTRIN) 200 MG  tablet Take 400 mg by mouth every 6 (six) hours as needed for moderate pain.    Historical Provider, MD  ketoconazole (NIZORAL) 2 % cream Apply liberally to affected area twice a day until the inflammation is resolved and then for 4 more days. 01/07/17   Chelle Jeffery, PA-C  Multiple Vitamin (MULTIVITAMIN WITH MINERALS) TABS tablet Take 1 tablet by mouth daily.    Historical Provider, MD  naproxen (NAPROSYN) 500 MG tablet Take 1 tablet (500 mg total) by mouth 2 (two) times daily. 01/08/17   [name], MD  nystatin-triamcinolone (MYCOLOG II) cream Apply  topically 3 (three) times daily. Patient not taking: Reported on 01/07/2017 02/20/13   Wanda Plump, MD  OVER THE COUNTER MEDICATION monostat cream    Historical Provider, MD  predniSONE (DELTASONE) 10 MG tablet Take 4 tablets (40 mg total) by mouth daily. 01/08/17 01/15/17  [name], MD    Family History Family History  Problem Relation Age of Onset  . Adopted: Yes  . Heart disease      GF  . Heart disease Father     Social History Social History  Substance Use Topics  . Smoking status: Never Smoker  . Smokeless tobacco: Never Used  . Alcohol use Yes     Comment: socially      Allergies   Patient has no known allergies.   Review of Systems Review of Systems  Constitutional: Negative for fever.  HENT: Negative for sore throat.   Eyes: Negative for visual disturbance.  Respiratory: Negative for shortness of breath.   Cardiovascular: Negative for chest pain.  Gastrointestinal: Negative for abdominal pain.  Genitourinary: Positive for penile pain and testicular pain. Negative for difficulty urinating, dysuria, frequency and hematuria.  Musculoskeletal: Positive for back pain. Negative for neck stiffness.  Skin: Negative for rash.  Neurological: Negative for syncope, weakness, numbness and headaches.     Physical Exam Updated Vital Signs BP 131/88 (BP Location: Left Arm)   Pulse 80   Temp 99.5 F (37.5 C) (Oral)   Resp 16   SpO2 100%   Physical Exam  Constitutional: He is oriented to person, place, and time. He appears well-developed and well-nourished. No distress.  HENT:  Head: Normocephalic and atraumatic.  Eyes: Conjunctivae and EOM are normal.  Neck: Normal range of motion.  Cardiovascular: Normal rate, regular rhythm, normal heart sounds and intact distal pulses.  Exam reveals no gallop and no friction rub.   No murmur heard. Pulmonary/Chest: Effort normal and breath sounds normal. No respiratory distress. He has no wheezes. He has no rales.    Abdominal: Soft. He exhibits no distension. There is no tenderness. There is no guarding.  Genitourinary: Cremasteric reflex is present.  Genitourinary Comments: Chaperone present: No testicular tenderness  Musculoskeletal: He exhibits tenderness. He exhibits no edema.  Paraspinal tenderness to the sacral area.   Neurological: He is alert and oriented to person, place, and time. He has normal strength. No sensory deficit.  Normal strength and sensation to BLE  Skin: Skin is warm and dry. He is not diaphoretic.  Nursing note and vitals reviewed.    ED Treatments / Results  DIAGNOSTIC STUDIES: Oxygen Saturation is 100% on RA, normal by my interpretation.    COORDINATION OF CARE: 7:17 PM Discussed treatment plan with pt at bedside and pt agreed to plan.   Labs (all labs ordered are listed, but only abnormal results are displayed) Labs Reviewed - No data to display  EKG  EKG Interpretation  None       Radiology No results found.  Procedures Procedures (including critical care time)  Medications Ordered in ED Medications - No data to display   Initial Impression / Assessment and Plan / ED Course  I have reviewed the triage vital signs and the nursing notes.  Pertinent labs & imaging results that were available during my care of the patient were reviewed by me and considered in my medical decision making (see chart for details).    31yo male presents with concern for back pain and penile/testicular pain. Pt denies trauma, has normal cremaster reflex,no tenderness on exam, and with normal past US of scrotum for similar symptoms doubt testicular torsion, epididymitis.  Patient has a normal neurologic exam and denies any urinary retention or overflow incontinence, stool incontinence, saddle anesthesia, fever, IV drug use, trauma, chronic steroid use or immunocompromise and have low suspicion suspicion for cauda equina, fracture, epidural abscess, or vertebral osteomyelitis.  Suspect nerve root compression, possible herniated disc, possible peripheral nerve compression ?ilioingouinal nerve entrapment.  Pain not improve with home OTC medications. Given prednisone rx for 1 week, flexeril, naproxen, gabapentin and recommend back exercises, PCP follow up. Patient discharged in stable condition with understanding of reasons to return.   Final Clinical Impressions(s) / ED Diagnoses   Final diagnoses:  Acute low back pain, unspecified back pain laterality, with sciatica presence unspecified  Neuropathic pain, suspect nerve related pain, possible herniated disc, possible peripheral nerve entrapment syndrome    New Prescriptions Discharge Medication List as of 01/08/2017  7:37 PM    START taking these medications   Details  cyclobenzaprine (FLEXERIL) 10 MG tablet Take 1 tablet (10 mg total) by mouth 3 (three) times daily as needed for muscle spasms., Starting Tue 01/08/2017, Print    gabapentin (NEURONTIN) 300 MG capsule Take 1 capsule (300 mg total) by mouth at bedtime. Frequency may be increased over time with physician direction., Starting Tue 01/08/2017, Print    naproxen (NAPROSYN) 500 MG tablet Take 1 tablet (500 mg total) by mouth 2 (two) times daily., Starting Tue 01/08/2017, Print    predniSONE (DELTASONE) 10 MG tablet Take 4 tablets (40 mg total) by mouth daily., Starting Tue 01/08/2017, Until Tue 01/15/2017, Print       I personally performed the services described in this documentation, which was scribed in my presence. The recorded information has been reviewed and is accurate.    Alvira MondayErin Kenita Bines, MD 01/09/17 [phone]

## 2017-01-09 ENCOUNTER — Ambulatory Visit (INDEPENDENT_AMBULATORY_CARE_PROVIDER_SITE_OTHER): Payer: Self-pay

## 2017-01-09 ENCOUNTER — Ambulatory Visit (INDEPENDENT_AMBULATORY_CARE_PROVIDER_SITE_OTHER): Payer: Self-pay | Admitting: Physician Assistant

## 2017-01-09 ENCOUNTER — Telehealth: Payer: Self-pay

## 2017-01-09 VITALS — BP 114/76 | HR 86 | Temp 99.2°F | Resp 16 | Ht 68.0 in | Wt 157.0 lb

## 2017-01-09 DIAGNOSIS — R21 Rash and other nonspecific skin eruption: Secondary | ICD-10-CM

## 2017-01-09 DIAGNOSIS — M5441 Lumbago with sciatica, right side: Secondary | ICD-10-CM

## 2017-01-09 DIAGNOSIS — M5442 Lumbago with sciatica, left side: Secondary | ICD-10-CM

## 2017-01-09 DIAGNOSIS — R1032 Left lower quadrant pain: Secondary | ICD-10-CM

## 2017-01-09 NOTE — Patient Instructions (Signed)
     IF you received an x-ray today, you will receive an invoice from [facility]. Please contact [facility] at [phone] with questions or concerns regarding your invoice.   IF you received labwork today, you will receive an invoice from [facility]. Please contact [facility] at [phone] with questions or concerns regarding your invoice.   Our billing staff will not be able to assist you with questions regarding bills from these companies.  You will be contacted with the lab results as soon as they are available. The fastest way to get your results is to activate your My Chart account. Instructions are located on the last page of this paperwork. If you have not heard from us regarding the results in 2 weeks, please contact this office.     

## 2017-01-09 NOTE — Telephone Encounter (Signed)
Pt is coming in 01/09/17 so will talk about concerns then

## 2017-01-09 NOTE — Progress Notes (Signed)
Patient ID: Craig Thomas, male    DOB: 11-Apr-1986, 31 y.o.   MRN: [id number]  PCP: No PCP Per Patient  Chief Complaint  Patient presents with  . Back Pain    lower, shooting pain in left leg, [place] on 01/08/17    Subjective:   Presents for follow after ED visit for back pain.  Pt is a 31 yo Caucasian male who presents after ED visit for back pain. Pt was seen in the [facility] ED last night for acute onset of lower back pain that started after he got back home from a run. At that time, he was prescribed cyclobenzaprine, gabapentin, prednisone, and naproxen. Pt states that he has been taking those medications as directed and he has seen a significant improvement in his back pain. He was able to get some good sleep last night for the first time in a few days. Pt states that the pain is still there and worsened by laying down flat or sitting for more than a few minutes. The pain is worse on the left and radiates down both legs. He states that he contacted the neurosurgeons office from his ED referral, but they told him he would need imaging of his back before being seen. Denies known trauma. Denies fever, chills, weight changes, loss of bladder or bowel function, or saddle anesthesia.  Pt also complians of an ongoing burning rash on his penis. He was seen here on 01/07/17 for penile rash and testicular pain and given topical Ketoconazole for a suspected yeast infection. Pt states that the rash area has enlarged, the burning has gotten worse, and there was some swelling of the area this morning, but now it is better. He states that he notices the burning more at night. Denies dysuria, frequency, urgency, discharge, or abdominal pain.  Review of Systems In addition to that stated in HPI above: Abd: Denies nausea, vomiting, diarrhea, or constipation.    Patient Active Problem List   Diagnosis Date Noted  . Rash and nonspecific skin eruption 02/20/2013  . Asthma      Prior to Admission  medications   Medication Sig Start Date End Date Taking? Authorizing Provider  albuterol (PROVENTIL HFA;VENTOLIN HFA) 108 (90 BASE) MCG/ACT inhaler Inhale 2 puffs into the lungs every 6 (six) hours as needed for wheezing.   Yes Historical Provider, MD  beclomethasone (QVAR) 80 MCG/ACT inhaler Inhale 2 puffs into the lungs 2 (two) times daily. Patient taking differently: Inhale 1 puff into the lungs daily.  02/20/13  Yes Wanda Plump, MD  cetirizine (ZYRTEC) 10 MG tablet Take 10 mg by mouth daily.   Yes Historical Provider, MD  cyclobenzaprine (FLEXERIL) 10 MG tablet Take 1 tablet (10 mg total) by mouth 3 (three) times daily as needed for muscle spasms. 01/08/17  Yes [name], MD  gabapentin (NEURONTIN) 300 MG capsule Take 1 capsule (300 mg total) by mouth at bedtime. Frequency may be increased over time with physician direction. 01/08/17  Yes [name], MD  ibuprofen (ADVIL,MOTRIN) 200 MG tablet Take 400 mg by mouth every 6 (six) hours as needed for moderate pain.   Yes Historical Provider, MD  ketoconazole (NIZORAL) 2 % cream Apply liberally to affected area twice a day until the inflammation is resolved and then for 4 more days. 01/07/17  Yes Chelle Jeffery, PA-C  Multiple Vitamin (MULTIVITAMIN WITH MINERALS) TABS tablet Take 1 tablet by mouth daily.   Yes Historical Provider, MD  naproxen (NAPROSYN) 500 MG  tablet Take 1 tablet (500 mg total) by mouth 2 (two) times daily. 01/08/17  Yes Alvira MondayErin Schlossman, MD  nystatin-triamcinolone (MYCOLOG II) cream Apply topically 3 (three) times daily. 02/20/13  Yes Wanda PlumpJose E Paz, MD  OVER THE COUNTER MEDICATION monostat cream   Yes Historical Provider, MD  predniSONE (DELTASONE) 10 MG tablet Take 4 tablets (40 mg total) by mouth daily. 01/08/17 01/15/17 Yes Alvira MondayErin Schlossman, MD  doxycycline (VIBRA-TABS) 100 MG tablet Take 1 tablet (100 mg total) by mouth 2 (two) times daily. Patient not taking: Reported on 01/09/2017 01/04/17   Doristine BosworthZoe A Stallings, MD     No Known  Allergies     Objective:  Physical Exam Pulm: Good respiratory effort. CTAB. No wheezes, rales, or rhonchi.  CV: RRR. No M/R/G. Abd: Soft, non-tender, non-distended. + BS x 4 quadrants. MSK: Tenderness to palpation of posterior-superior area of the iliac crest with radiation to the upper legs bilaterally with deep palpation. Pain is worsened with forward and lateral flexion at back, relief of pain in legs and scrotum with extension at back. + straight leg raise. Neuro: DTRs intact at patellar and achilles bilaterally.  GU: Uncircumcised external genitalia. Area of mild erythema just proximal to glans on dorsal surface of penis, circumventing ~4 cm in a counterclockwise direction. No exudate. Skin intact and dry. Mild tenderness to palpation of distal penis. Testicles are non-tender to palpation bilaterally.     Assessment & Plan:   1. Acute bilateral low back pain with bilateral sciatica Xray shows mild degenerative changes, no acute bony abnormalities. Pt advised to complete treatment regimen laid out by ED provider. Pt advised to follow up in two weeks to assess improvement and need for further imaging or orthopedics vs neurosurgery referrals.  - DG Lumbar Spine Complete; Future  2. Penile rash Pt advised to continue ketoconazole topical cream and attend referral to urology. - Ambulatory referral to Urology  3. Left groin pain Pt advised to not attend ultrasound imaging as scrotal pain seems to be closely related to back pain, pain has not worsened with discontinuation of Doxycyline, and pt continues to have benign testicular exams. - Ambulatory referral to Urology  Georgiana SpinnerHannah Bradley Varshini Arrants, PA-S

## 2017-01-09 NOTE — Progress Notes (Signed)
Patient ID: Craig MannsMichael Gramm, male    DOB: Aug 20, 1986, 31 y.o.   MRN: [id number]  PCP: No PCP Per Patient  Chief Complaint  Patient presents with  . Back Pain    lower, shooting pain in left leg, [place] on 01/08/17    Subjective:   Presents for evaluation of back pain.  He presented to the ED last night with back pain that started following a run several days ago. He was prescribed cyclobenzaprine, gabapentin, oral prednisone taper and naproxen, and notes he's had significant improvement. He was advised to contact a neurosurgeon for follow-up, but was advised that he needed imaging first.  The pain is worse with lying flat or sitting more than a few minutes. Pain is worse on the LEFT and radiates down both legs to the knees.  No recalled trauma, other than being a runner. History of low back pain with discomfort in the legs with sitting for years, but it was intermittent and wasn't ever this bad.  He wonders if this may be related to the pain he's been having in the LEFT scrotum. See notes 01/07/17 and 01/04/17.  No loss of bowel or bladder control. No saddle anesthesia. No LE weakness or paresthesias.    Review of Systems As above.    Patient Active Problem List   Diagnosis Date Noted  . Rash and nonspecific skin eruption 02/20/2013  . Asthma      Prior to Admission medications   Medication Sig Start Date End Date Taking? Authorizing Provider  albuterol (PROVENTIL HFA;VENTOLIN HFA) 108 (90 BASE) MCG/ACT inhaler Inhale 2 puffs into the lungs every 6 (six) hours as needed for wheezing.   Yes Historical Provider, MD  beclomethasone (QVAR) 80 MCG/ACT inhaler Inhale 2 puffs into the lungs 2 (two) times daily. Patient taking differently: Inhale 1 puff into the lungs daily.  02/20/13  Yes Wanda PlumpJose E Paz, MD  cetirizine (ZYRTEC) 10 MG tablet Take 10 mg by mouth daily.   Yes Historical Provider, MD  cyclobenzaprine (FLEXERIL) 10 MG tablet Take 1 tablet (10 mg total) by mouth 3  (three) times daily as needed for muscle spasms. 01/08/17  Yes Alvira MondayErin Schlossman, MD  gabapentin (NEURONTIN) 300 MG capsule Take 1 capsule (300 mg total) by mouth at bedtime. Frequency may be increased over time with physician direction. 01/08/17  Yes Alvira MondayErin Schlossman, MD  ibuprofen (ADVIL,MOTRIN) 200 MG tablet Take 400 mg by mouth every 6 (six) hours as needed for moderate pain.   Yes Historical Provider, MD  ketoconazole (NIZORAL) 2 % cream Apply liberally to affected area twice a day until the inflammation is resolved and then for 4 more days. 01/07/17  Yes Duilio Heritage, PA-C  Multiple Vitamin (MULTIVITAMIN WITH MINERALS) TABS tablet Take 1 tablet by mouth daily.   Yes Historical Provider, MD  naproxen (NAPROSYN) 500 MG tablet Take 1 tablet (500 mg total) by mouth 2 (two) times daily. 01/08/17  Yes Alvira MondayErin Schlossman, MD  nystatin-triamcinolone (MYCOLOG II) cream Apply topically 3 (three) times daily. 02/20/13  Yes Wanda PlumpJose E Paz, MD  OVER THE COUNTER MEDICATION monostat cream   Yes Historical Provider, MD  predniSONE (DELTASONE) 10 MG tablet Take 4 tablets (40 mg total) by mouth daily. 01/08/17 01/15/17 Yes Alvira MondayErin Schlossman, MD  doxycycline (VIBRA-TABS) 100 MG tablet Take 1 tablet (100 mg total) by mouth 2 (two) times daily. Patient not taking: Reported on 01/09/2017 01/04/17   Doristine BosworthZoe A Stallings, MD     No Known Allergies  Objective:  Physical Exam  Constitutional: He is oriented to person, place, and time. He appears well-developed and well-nourished. He is active and cooperative. No distress.  BP 114/76   Pulse 86   Temp 99.2 F (37.3 C) (Oral)   Resp 16   Ht 5\' 8"  (1.727 m)   Wt 157 lb (71.2 kg)   SpO2 100%   BMI 23.87 kg/m   HENT:  Head: Normocephalic and atraumatic.  Right Ear: Hearing normal.  Left Ear: Hearing normal.  Eyes: Conjunctivae are normal. No scleral icterus.  Neck: Normal range of motion. Neck supple. No thyromegaly present.  Cardiovascular: Normal rate, regular rhythm and  normal heart sounds.   Pulses:      Radial pulses are 2+ on the right side, and 2+ on the left side.  Pulmonary/Chest: Effort normal and breath sounds normal.  Abdominal: Hernia confirmed negative in the right inguinal area and confirmed negative in the left inguinal area.  Genitourinary: Cremasteric reflex is present. Right testis shows no mass, no swelling and no tenderness. Right testis is descended. Left testis shows tenderness. Left testis shows no mass and no swelling. Left testis is descended. Circumcised. No phimosis, paraphimosis, hypospadias, penile erythema or penile tenderness. No discharge found.  Musculoskeletal:       Lumbar back: He exhibits tenderness and pain (with flexion and extension, though extension relieves pain in the legs and scrotum). He exhibits normal range of motion, no bony tenderness, no deformity and no spasm.  Lymphadenopathy:       Head (right side): No tonsillar, no preauricular, no posterior auricular and no occipital adenopathy present.       Head (left side): No tonsillar, no preauricular, no posterior auricular and no occipital adenopathy present.    He has no cervical adenopathy.       Right: No inguinal and no supraclavicular adenopathy present.       Left: No inguinal and no supraclavicular adenopathy present.  Neurological: He is alert and oriented to person, place, and time. He has normal strength. No sensory deficit. Coordination and gait normal.  Reflex Scores:      Patellar reflexes are 2+ on the right side and 2+ on the left side.      Achilles reflexes are 2+ on the right side and 2+ on the left side. Skin: Skin is warm, dry and intact. No rash noted. No cyanosis or erythema. Nails show no clubbing.  Psychiatric: He has a normal mood and affect. His speech is normal and behavior is normal.       Dg Lumbar Spine Complete  Result Date: 01/09/2017 CLINICAL DATA:  Acute back pain.  No prior injury. EXAM: LUMBAR SPINE - COMPLETE 4+ VIEW  COMPARISON:  No recent. FINDINGS: No acute bony abnormality identified. Mild diffuse degenerative change. Normal alignment mineralization. IMPRESSION: Mild diffuse degenerative change. No acute or focal bony abnormality. Electronically Signed   By: Maisie Fus  Register   On: 01/09/2017 16:59       Assessment & Plan:   1. 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 Complete oral steroid taper and RTC if symptoms worsen/persist or recur following treatment. - DG Lumbar Spine Complete; Future  2. Penile rash 3. Left groin pain - Ambulatory referral to Urology   Fernande Bras, PA-C Physician Assistant-Certified Primary Care at [facility]

## 2017-01-11 ENCOUNTER — Other Ambulatory Visit: Payer: Self-pay | Admitting: Urology

## 2017-01-11 DIAGNOSIS — R109 Unspecified abdominal pain: Secondary | ICD-10-CM

## 2017-01-12 ENCOUNTER — Ambulatory Visit (HOSPITAL_COMMUNITY)
Admission: RE | Admit: 2017-01-12 | Discharge: 2017-01-12 | Disposition: A | Discharge status: Home / Self Care | Payer: Self-pay | Source: Ambulatory Visit | Attending: Urology | Admitting: Urology

## 2017-01-12 DIAGNOSIS — M5126 Other intervertebral disc displacement, lumbar region: Secondary | ICD-10-CM | POA: Insufficient documentation

## 2017-01-12 DIAGNOSIS — R109 Unspecified abdominal pain: Secondary | ICD-10-CM | POA: Insufficient documentation

## 2017-01-12 MED ORDER — GADOBENATE DIMEGLUMINE 529 MG/ML IV SOLN
15.0000 mL | Freq: Once | INTRAVENOUS | Status: AC | PRN
  Administered 2017-01-12: 14 mL via INTRAVENOUS

## 2017-01-16 ENCOUNTER — Ambulatory Visit (INDEPENDENT_AMBULATORY_CARE_PROVIDER_SITE_OTHER): Payer: Self-pay | Admitting: Physician Assistant

## 2017-01-16 DIAGNOSIS — M5116 Intervertebral disc disorders with radiculopathy, lumbar region: Secondary | ICD-10-CM | POA: Insufficient documentation

## 2017-01-16 MED ORDER — CYCLOBENZAPRINE HCL 10 MG PO TABS: 10.0000 mg | ORAL_TABLET | Freq: Three times a day (TID) | ORAL | 0 refills | Status: DC | PRN

## 2017-01-16 MED ORDER — NAPROXEN 500 MG PO TABS: 500.0000 mg | ORAL_TABLET | Freq: Two times a day (BID) | ORAL | 0 refills | Status: DC

## 2017-01-16 NOTE — Patient Instructions (Signed)
     IF you received an x-ray today, you will receive an invoice from [facility]. Please contact [facility] at [phone] with questions or concerns regarding your invoice.   IF you received labwork today, you will receive an invoice from [facility]. Please contact [facility] at [phone] with questions or concerns regarding your invoice.   Our billing staff will not be able to assist you with questions regarding bills from these companies.  You will be contacted with the lab results as soon as they are available. The fastest way to get your results is to activate your My Chart account. Instructions are located on the last page of this paperwork. If you have not heard from us regarding the results in 2 weeks, please contact this office.     

## 2017-01-16 NOTE — Progress Notes (Signed)
Patient ID: Craig MannsMichael Bergeron, male    DOB: 1986/05/22, 31 y.o.   MRN: [id number]  PCP: Porfirio Oarhelle Tiffanye Hartmann, PA-C  Chief Complaint  Patient presents with  . Follow-up    Back Pain, Pt states MRI results were sent over this AM.    Subjective:   Presents for evaluation of back pain and review of MRI ordered by urology.  Please see notes 2/09, 2/12 and 2/14.  Pain in back is getting better, but continues to have burning and irritation in the groin and penis. Hydrocodone and gabapentin were added to his regimen. He sees a [facility]rologist at [facility] next week "just to be sure" there isn't a genital vascular problem. Able to work yesterday. When sitting, especially on hard surfaces, feels pain down the back of the leg to the knee. No numbness, tingling.Some delayed starting of the urinatry stream. No incontinence. No loss of bowel control. Using stool softener to prevent constipation with hydrocodone. No weakness.  He has had similar symptoms in the past, especially testicular sensitivity and buttock pain with sitting.    Review of Systems  Constitutional: Negative for chills, fatigue and fever.  Respiratory: Negative for shortness of breath.   Cardiovascular: Negative for chest pain, palpitations and leg swelling.  Gastrointestinal: Negative for abdominal pain and nausea.  Genitourinary: Negative for difficulty urinating (though notes that his urinarey stream is split), discharge, dysuria, flank pain, frequency, hematuria, penile pain, penile swelling, scrotal swelling (pain on the LEFT) and urgency.  Musculoskeletal: Positive for back pain. Negative for arthralgias and gait problem.  Neurological: Negative for dizziness, weakness and numbness.  Hematological: Negative for adenopathy. Does not bruise/bleed easily.       Patient Active Problem List   Diagnosis Date Noted  . Rash and nonspecific skin eruption 02/20/2013  . Asthma    Prior to Admission medications   Medication Sig  Start Date End Date Taking? Authorizing Provider  albuterol (PROVENTIL HFA;VENTOLIN HFA) 108 (90 BASE) MCG/ACT inhaler Inhale 2 puffs into the lungs every 6 (six) hours as needed for wheezing.   Yes Historical Provider, MD  beclomethasone (QVAR) 80 MCG/ACT inhaler Inhale 2 puffs into the lungs 2 (two) times daily. Patient taking differently: Inhale 1 puff into the lungs daily.  02/20/13  Yes Wanda PlumpJose E Paz, MD  cetirizine (ZYRTEC) 10 MG tablet Take 10 mg by mouth daily.   Yes Historical Provider, MD  cyclobenzaprine (FLEXERIL) 10 MG tablet Take 1 tablet (10 mg total) by mouth 3 (three) times daily as needed for muscle spasms. 01/08/17  Yes Alvira MondayErin Schlossman, MD  gabapentin (NEURONTIN) 100 MG capsule Take 100 mg by mouth 3 (three) times daily.   Yes Historical Provider, MD  Hydrocodone-Acetaminophen 5-300 MG TABS Take by mouth.   Yes Historical Provider, MD  ibuprofen (ADVIL,MOTRIN) 200 MG tablet Take 400 mg by mouth every 6 (six) hours as needed for moderate pain.   NO Historical Provider, MD  Multiple Vitamin (MULTIVITAMIN WITH MINERALS) TABS tablet Take 1 tablet by mouth daily.   Yes Historical Provider, MD  naproxen (NAPROSYN) 500 MG tablet Take 1 tablet (500 mg total) by mouth 2 (two) times daily. 01/08/17  Yes Alvira MondayErin Schlossman, MD  predniSONE (DELTASONE) 10 MG tablet Take 40 mg by mouth daily with breakfast.   Yes Historical Provider, MD        No Known Allergies     Objective:  Physical Exam  Constitutional: He is oriented to person, place, and time. He appears well-developed and well-nourished. He  is active and cooperative. No distress.  BP 116/74   Pulse 95   Temp 99.4 F (37.4 C) (Oral)   Resp 16   Ht 5\' 8"  (1.727 m)   Wt 156 lb (70.8 kg)   SpO2 99%   BMI 23.72 kg/m   HENT:  Head: Normocephalic and atraumatic.  Right Ear: Hearing normal.  Left Ear: Hearing normal.  Eyes: Conjunctivae are normal. No scleral icterus.  Neck: Normal range of motion. Neck supple. No thyromegaly  present.  Cardiovascular: Normal rate, regular rhythm and normal heart sounds.   Pulses:      Radial pulses are 2+ on the right side, and 2+ on the left side.  Pulmonary/Chest: Effort normal and breath sounds normal.  Lymphadenopathy:       Head (right side): No tonsillar, no preauricular, no posterior auricular and no occipital adenopathy present.       Head (left side): No tonsillar, no preauricular, no posterior auricular and no occipital adenopathy present.    He has no cervical adenopathy.       Right: No supraclavicular adenopathy present.       Left: No supraclavicular adenopathy present.  Neurological: He is alert and oriented to person, place, and time. He has normal reflexes. No sensory deficit.  Skin: Skin is warm, dry and intact. No rash noted. No cyanosis or erythema. Nails show no clubbing.  Psychiatric: He has a normal mood and affect. His speech is normal and behavior is normal.    Mr Lumbar Spine W Wo Contrast  Result Date: 01/12/2017 CLINICAL DATA:  31 year old male with back and abdominal pain. Lower extremity weakness. Penile and testicular pain and burning. Gonad skin inflammation worsening for 3 weeks. Fever. Initial encounter. EXAM: MRI LUMBAR SPINE WITHOUT AND WITH CONTRAST TECHNIQUE: Multiplanar and multiecho pulse sequences of the lumbar spine were obtained without and with intravenous contrast. CONTRAST:  14mL MULTIHANCE GADOBENATE DIMEGLUMINE 529 MG/ML IV SOLN COMPARISON:  Lumbar radiographs 01/09/2017 FINDINGS: Segmentation:  Normal as seen on the recent radiographs. Alignment: Stable. Straightening of lumbar lordosis. Subtle retrolisthesis of L4 on L5. Vertebrae: Normal bone marrow signal. No marrow edema or evidence of acute osseous abnormality. Conus medullaris: Extends to the L1 level and appears normal. Normal cauda equina nerve roots. No abnormal intradural enhancement. Paraspinal and other soft tissues: Negative. No soft tissue inflammation identified. Disc  levels: T11-T12:  Negative. T12-L1:  Negative. L1-L2:  Negative. L2-L3:  Negative. L3-L4:  Negative. L4-L5: Mild disc desiccation. Mild circumferential disc bulge. Small broad-based central disc protrusion (series 8, image 7 and series 6, image 25). Mild mass effect on the ventral thecal sac with up to mild bilateral lateral recess stenosis (descending L5 nerve root levels), but no spinal stenosis. No foraminal involvement or stenosis. L5-S1: Mild disc desiccation. Small right paracentral disc protrusion with annular fissure (series 8, image 8 and series 6, image 30). Borderline to mild facet hypertrophy. Mild mass effect on the ventral thecal sac without spinal or convincing lateral recess stenosis. No foraminal stenosis. IMPRESSION: 1. 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 2. Small broad-based disc herniation at L4-L5 with up to mild bilateral lateral recess stenosis (bilateral L5 nerve root levels), but no spinal stenosis. Smaller disc herniation at L5-S1 with annular fissure, but no subsequent neural impingement. Electronically Signed   By: Odessa Fleming M.D.   On: 01/12/2017 13:48          Assessment & Plan:   1. Lumbar disc herniation with  radiculopathy Continue NSAID and muscle relaxer. Refer to neurosurgery. - Ambulatory referral to Neurosurgery - cyclobenzaprine (FLEXERIL) 10 MG tablet; Take 1 tablet (10 mg total) by mouth 3 (three) times daily as needed for muscle spasms.  Dispense: 20 tablet; Refill: 0 - naproxen (NAPROSYN) 500 MG tablet; Take 1 tablet (500 mg total) by mouth 2 (two) times daily.  Dispense: 60 tablet; Refill: 0   Fernande Bras, PA-C Physician Assistant-Certified Primary Care at [facility]

## 2017-01-18 ENCOUNTER — Telehealth: Payer: Self-pay

## 2017-01-18 NOTE — Telephone Encounter (Signed)
Notes are complete. Please send notes 2/09, 2/12, 2/14 and 2/21

## 2017-01-18 NOTE — Telephone Encounter (Signed)
Referral has not yet been sent. Will send to Neurosurgery once OV notes from last visit are completed.

## 2017-01-18 NOTE — Telephone Encounter (Signed)
Pt was seen on 01/16/17 and was told he would hear back about a neuro referral by 01/18/17 or to touch base about the referral by then. He would like a call back regarding this information at [phone].

## 2017-01-21 NOTE — Telephone Encounter (Signed)
Please send today.

## 2017-01-22 ENCOUNTER — Ambulatory Visit: Payer: Self-pay | Admitting: Physician Assistant

## 2017-01-25 ENCOUNTER — Other Ambulatory Visit: Payer: Self-pay | Admitting: Neurosurgery

## 2017-01-25 ENCOUNTER — Encounter: Payer: Self-pay | Admitting: Family Medicine

## 2017-01-25 ENCOUNTER — Ambulatory Visit (INDEPENDENT_AMBULATORY_CARE_PROVIDER_SITE_OTHER): Payer: Self-pay | Admitting: Family Medicine

## 2017-01-25 VITALS — BP 120/88 | HR 89 | Temp 98.3°F | Resp 16 | Ht 68.0 in | Wt 161.4 lb

## 2017-01-25 DIAGNOSIS — M5116 Intervertebral disc disorders with radiculopathy, lumbar region: Secondary | ICD-10-CM

## 2017-01-25 DIAGNOSIS — N50819 Testicular pain, unspecified: Secondary | ICD-10-CM

## 2017-01-25 DIAGNOSIS — M5442 Lumbago with sciatica, left side: Secondary | ICD-10-CM

## 2017-01-25 DIAGNOSIS — R1032 Left lower quadrant pain: Secondary | ICD-10-CM

## 2017-01-25 DIAGNOSIS — M5441 Lumbago with sciatica, right side: Secondary | ICD-10-CM

## 2017-01-25 MED ORDER — HYDROCODONE-ACETAMINOPHEN 5-325 MG PO TABS: 1.0000 | ORAL_TABLET | Freq: Three times a day (TID) | ORAL | 0 refills | Status: DC | PRN

## 2017-01-25 NOTE — Patient Instructions (Signed)
     IF you received an x-ray today, you will receive an invoice from [facility]. Please contact [facility] at [phone] with questions or concerns regarding your invoice.   IF you received labwork today, you will receive an invoice from [facility]. Please contact [facility] at [phone] with questions or concerns regarding your invoice.   Our billing staff will not be able to assist you with questions regarding bills from these companies.  You will be contacted with the lab results as soon as they are available. The fastest way to get your results is to activate your My Chart account. Instructions are located on the last page of this paperwork. If you have not heard from us regarding the results in 2 weeks, please contact this office.     

## 2017-01-25 NOTE — Progress Notes (Signed)
Chief Complaint  Patient presents with  . Back Pain  . Leg Pain    bilateral    HPI   Pt had an initial consult with Neurosurgery and was told that he did not have any issues with the spinal cord. He reports that he gets burning pain in the legs, low back and scrotum that is improved when he lies on the right side with his legs bent He reports that his testicular pain is constant Gabapentin is not helping He has a hard time sitting unless he is sitting on the edge of the chair and leaning forward and flexed at the waist He gets tingling and numbness in his feet In his penis he feels burning, tearing and a dull ache.  He reports that he is getting pain on the left side that seems new and is where "his torso and legs join"   Past Medical History:  Diagnosis Date  . Allergic    pet and enviromental  . Asthma   . 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 albuterol (PROVENTIL HFA;VENTOLIN HFA) 108 (90 BASE) MCG/ACT inhaler Inhale 2 puffs into the lungs every 6 (six) hours as needed for wheezing.    . beclomethasone (QVAR) 80 MCG/ACT inhaler Inhale 2 puffs into the lungs 2 (two) times daily. (Patient taking differently: Inhale 1 puff into the lungs daily. ) 1 Inhaler 6  . cetirizine (ZYRTEC) 10 MG tablet Take 10 mg by mouth daily.    . cyclobenzaprine (FLEXERIL) 10 MG tablet Take 1 tablet (10 mg total) by mouth 3 (three) times daily as needed for muscle spasms. 20 tablet 0  . gabapentin (NEURONTIN) 100 MG capsule Take 100 mg by mouth 3 (three) times daily.    Marland Kitchen ibuprofen (ADVIL,MOTRIN) 200 MG tablet Take 400 mg by mouth every 6 (six) hours as needed for moderate pain.    . Multiple Vitamin (MULTIVITAMIN WITH MINERALS) TABS tablet Take 1 tablet by mouth daily.    . naproxen (NAPROSYN) 500 MG tablet Take 1 tablet (500 mg total) by mouth 2 (two) times daily. 60 tablet 0  .  HYDROcodone-acetaminophen (NORCO) 5-325 MG tablet Take 1 tablet by mouth every 8 (eight) hours as needed for moderate pain. 30 tablet 0   No current facility-administered medications for this visit.     Allergies: No Known Allergies  Past Surgical History:  Procedure Laterality Date  . TONSILLECTOMY      Social History   Social History  . Marital status: Married    Spouse name: N/A  . Number of children: 0  . Years of education: N/A   Occupational History  . Network engineer   Social History Main Topics  . Smoking status: Never Smoker  . Smokeless tobacco: Never Used  . Alcohol use Yes     Comment: socially   . Drug use: No  . Sexual activity: Yes   Other Topics Concern  . Not on file   Social History Narrative  . No narrative on file    Review of Systems  Constitutional: Negative for chills and fever.  Cardiovascular: Negative for chest pain, palpitations and orthopnea.  Gastrointestinal: Negative for abdominal pain, nausea and vomiting.  Genitourinary: Negative for dysuria, frequency, hematuria and urgency.   See hpi  Objective: Vitals:   01/25/17 1108  BP: 120/88  Pulse: 89  Resp: 16  Temp: 98.3 F (  36.8 C)  TempSrc: Oral  SpO2: 100%  Weight: 161 lb 6.4 oz (73.2 kg)  Height: 5\' 8"  (1.727 m)    Physical Exam  Constitutional: He is oriented to person, place, and time. He appears well-developed and well-nourished.  HENT:  Head: Normocephalic and atraumatic.  Cardiovascular: Normal rate, regular rhythm and normal heart sounds.   Pulmonary/Chest: Effort normal and breath sounds normal. No respiratory distress. He has no wheezes.  Neurological: He is alert and oriented to person, place, and time. He displays normal reflexes.      CLINICAL DATA:  31 year old male with back and abdominal pain. Lower extremity weakness. Penile and testicular pain and burning. Gonad skin inflammation worsening for 3 weeks. Fever. Initial encounter.  EXAM: MRI  LUMBAR SPINE WITHOUT AND WITH CONTRAST  TECHNIQUE: Multiplanar and multiecho pulse sequences of the lumbar spine were obtained without and with intravenous contrast.  CONTRAST:  14mL MULTIHANCE GADOBENATE DIMEGLUMINE 529 MG/ML IV SOLN  COMPARISON:  Lumbar radiographs 01/09/2017  FINDINGS: Segmentation:  Normal as seen on the recent radiographs.  Alignment: Stable. Straightening of lumbar lordosis. Subtle retrolisthesis of L4 on L5.  Vertebrae: Normal bone marrow signal. No marrow edema or evidence of acute osseous abnormality.  Conus medullaris: Extends to the L1 level and appears normal. Normal cauda equina nerve roots. No abnormal intradural enhancement.  Paraspinal and other soft tissues: Negative. No soft tissue inflammation identified.  Disc levels:  T11-T12:  Negative.  T12-L1:  Negative.  L1-L2:  Negative.  L2-L3:  Negative.  L3-L4:  Negative.  L4-L5: Mild disc desiccation. Mild circumferential disc bulge. Small broad-based central disc protrusion (series 8, image 7 and series 6, image 25). Mild mass effect on the ventral thecal sac with up to mild bilateral lateral recess stenosis (descending L5 nerve root levels), but no spinal stenosis. No foraminal involvement or stenosis.  L5-S1: Mild disc desiccation. Small right paracentral disc protrusion with annular fissure (series 8, image 8 and series 6, image 30). Borderline to mild facet hypertrophy. Mild mass effect on the ventral thecal sac without spinal or convincing lateral recess stenosis. No foraminal stenosis.  IMPRESSION: 1. 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 2. Small broad-based disc herniation at L4-L5 with up to mild bilateral lateral recess stenosis (bilateral L5 nerve root levels), but no spinal stenosis. Smaller disc herniation at L5-S1 with annular fissure, but no subsequent neural impingement.   Electronically Signed   By: Odessa FlemingH  Hall  M.D.   On: 01/12/2017 13:48   Assessment and Plan Craig Thomas was seen today for back pain and leg pain.  Diagnoses and all orders for this visit:  Lumbar disc herniation with radiculopathy Acute bilateral low back pain with bilateral sciatica Left groin pain Reviewed MRIs and notes from Urology  Pt to follow up with CT pelvis per request from neurosurgery Gave norco for pain -     HYDROcodone-acetaminophen (NORCO) 5-325 MG tablet; Take 1 tablet by mouth every 8 (eight) hours as needed for moderate pain.     Jame Morrell A Eniyah Eastmond

## 2017-01-29 ENCOUNTER — Ambulatory Visit
Admission: RE | Admit: 2017-01-29 | Discharge: 2017-01-29 | Disposition: A | Discharge status: Home / Self Care | Payer: No Typology Code available for payment source | Source: Ambulatory Visit | Attending: Neurosurgery | Admitting: Neurosurgery

## 2017-01-29 DIAGNOSIS — N50819 Testicular pain, unspecified: Secondary | ICD-10-CM

## 2017-01-29 MED ORDER — IOPAMIDOL (ISOVUE-300) INJECTION 61%
100.0000 mL | Freq: Once | INTRAVENOUS | Status: AC | PRN
  Administered 2017-01-29: 100 mL via INTRAVENOUS

## 2017-01-30 ENCOUNTER — Other Ambulatory Visit: Payer: Self-pay | Admitting: Emergency Medicine

## 2017-01-30 ENCOUNTER — Encounter: Payer: Self-pay | Admitting: Family Medicine

## 2017-01-30 ENCOUNTER — Encounter: Payer: Self-pay | Admitting: Physician Assistant

## 2017-01-30 DIAGNOSIS — N50819 Testicular pain, unspecified: Secondary | ICD-10-CM

## 2017-01-30 DIAGNOSIS — M5116 Intervertebral disc disorders with radiculopathy, lumbar region: Secondary | ICD-10-CM

## 2017-02-01 ENCOUNTER — Encounter: Payer: Self-pay | Admitting: Physician Assistant

## 2017-02-07 ENCOUNTER — Ambulatory Visit
Admission: RE | Admit: 2017-02-07 | Discharge: 2017-02-07 | Disposition: A | Discharge status: Home / Self Care | Payer: No Typology Code available for payment source | Source: Ambulatory Visit | Attending: Family Medicine | Admitting: Family Medicine

## 2017-02-07 DIAGNOSIS — N50819 Testicular pain, unspecified: Secondary | ICD-10-CM

## 2017-02-08 ENCOUNTER — Ambulatory Visit (INDEPENDENT_AMBULATORY_CARE_PROVIDER_SITE_OTHER): Payer: Self-pay | Admitting: Family Medicine

## 2017-02-08 ENCOUNTER — Encounter: Payer: Self-pay | Admitting: Family Medicine

## 2017-02-08 VITALS — BP 119/77 | HR 83 | Temp 98.4°F | Resp 17 | Ht 69.5 in | Wt 163.0 lb

## 2017-02-08 DIAGNOSIS — N5082 Scrotal pain: Secondary | ICD-10-CM

## 2017-02-08 DIAGNOSIS — N4889 Other specified disorders of penis: Secondary | ICD-10-CM

## 2017-02-08 DIAGNOSIS — R457 State of emotional shock and stress, unspecified: Secondary | ICD-10-CM

## 2017-02-08 MED ORDER — GABAPENTIN 300 MG PO CAPS: 300.0000 mg | ORAL_CAPSULE | Freq: Three times a day (TID) | ORAL | 6 refills | Status: DC

## 2017-02-08 NOTE — Patient Instructions (Signed)
     IF you received an x-ray today, you will receive an invoice from [facility]. Please contact [facility] at [phone] with questions or concerns regarding your invoice.   IF you received labwork today, you will receive an invoice from [facility]. Please contact [facility] at [phone] with questions or concerns regarding your invoice.   Our billing staff will not be able to assist you with questions regarding bills from these companies.  You will be contacted with the lab results as soon as they are available. The fastest way to get your results is to activate your My Chart account. Instructions are located on the last page of this paperwork. If you have not heard from us regarding the results in 2 weeks, please contact this office.     

## 2017-02-08 NOTE — Progress Notes (Signed)
Chief Complaint  Patient presents with  . Follow-up    groin pain , lumbar pain     HPI   Patient reports that he has been doing leg stretching that has helped his lumbar pain He reports that with sitting on his back he started losing sensation in the left side of the low back and running down his bottom.  He plans to go to his follow up appointment with Urology just to discuss that the nystatin and doxycycline did not help his penis and he still has pain at the glans and shaft of the penis.   He reports that the gabapentin does not help much but he keeps taking it. He has not increased his dose. He is currently taking Gabapentin 100mg  tid.    Mood He states that the stretches that he has been doing helps his sacral pain but does not relieve his pain in his penis.   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 He feels like he cannot go out and enjoy life. He denies SI.    Past Medical History:  Diagnosis Date  . Allergic    pet and enviromental  . Asthma   . 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 albuterol (PROVENTIL HFA;VENTOLIN HFA) 108 (90 BASE) MCG/ACT inhaler Inhale 2 puffs into the lungs every 6 (six) hours as needed for wheezing.    . beclomethasone (QVAR) 80 MCG/ACT inhaler Inhale 2 puffs into the lungs 2 (two) times daily. (Patient taking differently: Inhale 1 puff into the lungs daily. ) 1 Inhaler 6  . cetirizine (ZYRTEC) 10 MG tablet Take 10 mg by mouth daily.    . cyclobenzaprine (FLEXERIL) 10 MG tablet Take 1 tablet (10 mg total) by mouth 3 (three) times daily as needed for muscle spasms. 20 tablet 0  . gabapentin (NEURONTIN) 300 MG capsule Take 1 capsule (300 mg total) by mouth 3 (three) times daily. 90 capsule 6    . HYDROcodone-acetaminophen (NORCO) 5-325 MG tablet Take 1 tablet by mouth every 8 (eight) hours as needed for moderate pain. 30 tablet 0  . ibuprofen (ADVIL,MOTRIN) 200 MG tablet Take 400 mg by mouth every 6 (six) hours as needed for moderate pain.    . Multiple Vitamin (MULTIVITAMIN WITH MINERALS) TABS tablet Take 1 tablet by mouth daily.    . naproxen (NAPROSYN) 500 MG tablet Take 1 tablet (500 mg total) by mouth 2 (two) times daily. 60 tablet 0   No current facility-administered medications for this visit.     Allergies: No Known Allergies  Past Surgical History:  Procedure Laterality Date  . TONSILLECTOMY      Social History   Social History  . Marital status: Married    Spouse name: N/A  . Number of children: 0  . Years of education: N/A   Occupational History  . Network engineer   Social History Main Topics  . Smoking status: Never Smoker  . Smokeless tobacco: Never Used  . Alcohol use Yes     Comment: socially   . Drug use: No  . Sexual activity: Yes   Other Topics Concern  . None   Social History Narrative  . None  ROS See  hpi  Objective: Vitals:   02/08/17 1413  BP: 119/77  Pulse: 83  Resp: 17  Temp: 98.4 F (36.9 C)  TempSrc: Oral  SpO2: 97%  Weight: 163 lb (73.9 kg)  Height: 5' 9.5" (1.765 m)    Physical Exam  Constitutional: He is oriented to person, place, and time. He appears well-developed and well-nourished.  HENT:  Head: Normocephalic and atraumatic.  Pulmonary/Chest: Effort normal.  Genitourinary:  Genitourinary Comments: Normal appearing glans and shaft, normal   Neurological: He is alert and oriented to person, place, and time.     Assessment and Plan Craig Thomas was seen today for follow-up.  Diagnoses and all orders for this visit:  Scrotal pain Penis pain -     Ambulatory referral to Integrative Medicine -     gabapentin (NEURONTIN) 300 MG capsule; Take 1 capsule (300 mg total) by mouth 3 (three) times  daily.  Referred to Integrative Medicine for pelvic floor therapy Discussed that he should increase gabapentin 300mg  tid and if he improves then will recommend an EEG   Emotional stress- Gave information for Psychiatry for patient to call   A total of 30 minutes were spent face-to-face with the patient during this encounter and over half of that time was spent on counseling and coordination of care.     Zoe A Stallings

## 2017-02-13 DIAGNOSIS — M6289 Other specified disorders of muscle: Secondary | ICD-10-CM | POA: Insufficient documentation

## 2017-02-19 ENCOUNTER — Ambulatory Visit: Payer: Self-pay | Admitting: Physician Assistant

## 2017-02-20 ENCOUNTER — Encounter: Payer: Self-pay | Admitting: Family Medicine

## 2017-02-21 ENCOUNTER — Ambulatory Visit (INDEPENDENT_AMBULATORY_CARE_PROVIDER_SITE_OTHER): Payer: Self-pay | Admitting: Physician Assistant

## 2017-02-21 VITALS — BP 131/85 | HR 83 | Temp 98.4°F | Resp 16 | Ht 69.5 in | Wt 162.0 lb

## 2017-02-21 DIAGNOSIS — M5116 Intervertebral disc disorders with radiculopathy, lumbar region: Secondary | ICD-10-CM

## 2017-02-21 MED ORDER — GABAPENTIN 300 MG PO CAPS: 600.0000 mg | ORAL_CAPSULE | Freq: Three times a day (TID) | ORAL | 6 refills | Status: DC

## 2017-02-21 MED ORDER — HYDROCODONE-ACETAMINOPHEN 5-325 MG PO TABS: 1.0000 | ORAL_TABLET | Freq: Three times a day (TID) | ORAL | 0 refills | Status: AC | PRN

## 2017-02-21 NOTE — Progress Notes (Signed)
Patient ID: Craig Thomas, male    DOB: [birth year], 31 y.o.   MRN: [id number]  PCP: Porfirio Oar, PA-C  Chief Complaint  Patient presents with  . Follow-up    pain has reduced, but has lost sensation in left lower back radiates down buttocks and left leg and calf    Subjective:   Presents for evaluation of LEFT back and groin pain.  Please see notes 2/09-present for this somewhat complicated history.  Evidence of nerve impingement on testing at [facility]. Desensitization of the skin in the low back, buttock and posterior thigh. Study revealed compensation on the RIGHT side to support the LEFT. Has noticed reduced sensation of the anterior thigh with stretching, and some laxity of the knee with sensation of hyperextension.  [facility] follow-up. Erythema on the penis continues to worsen, slowly. Using yet another topical product, a steroid. No change in 5-6 days of use.  Gabapentin has helped reduce the pain, allowing increased activity. Unfortunately, increased activity results in increased pain at night. Had previously found comfortable position on the RIGHT side, but he is finding that he is now uncomfortable RIGHT side lying as well. Has starting to use a cane, as increasing weight bearing on the LEFT leg increases his back pain.  No loss of control of bowel/bladder.  Review of Systems As above.    Patient Active Problem List   Diagnosis Date Noted  . Lumbar disc herniation with radiculopathy 01/16/2017  . Rash and nonspecific skin eruption 02/20/2013  . Asthma      Prior to Admission medications   Medication Sig Start Date End Date Taking? Authorizing Provider  albuterol (PROVENTIL HFA;VENTOLIN HFA) 108 (90 BASE) MCG/ACT inhaler Inhale 2 puffs into the lungs every 6 (six) hours as needed for wheezing.   Yes Historical Provider, MD  beclomethasone (QVAR) 80 MCG/ACT inhaler Inhale 2 puffs into the lungs 2 (two) times  daily. Patient taking differently: Inhale 1 puff into the lungs daily.  02/20/13  Yes Wanda Plump, MD  cetirizine (ZYRTEC) 10 MG tablet Take 10 mg by mouth daily.   Yes Historical Provider, MD  gabapentin (NEURONTIN) 300 MG capsule Take 1 capsule (300 mg total) by mouth 3 (three) times daily. 02/08/17  Yes Doristine Bosworth, MD  Multiple Vitamin (MULTIVITAMIN WITH MINERALS) TABS tablet Take 1 tablet by mouth daily.   Yes Historical Provider, MD  naproxen (NAPROSYN) 500 MG tablet Take 1 tablet (500 mg total) by mouth 2 (two) times daily. 01/16/17  Yes Gerad Cornelio, PA-C  cyclobenzaprine (FLEXERIL) 10 MG tablet Take 1 tablet (10 mg total) by mouth 3 (three) times daily as needed for muscle spasms. Patient not taking: Reported on 02/21/2017 01/16/17   Porfirio Oar, PA-C  HYDROcodone-acetaminophen (NORCO) 5-325 MG tablet Take 1 tablet by mouth every 8 (eight) hours as needed for moderate pain. Patient not taking: Reported on 02/21/2017 01/25/17 02/24/17  Doristine Bosworth, MD  ibuprofen (ADVIL,MOTRIN) 200 MG tablet Take 400 mg by mouth every 6 (six) hours as needed for moderate pain.    Historical Provider, MD     No Known Allergies     Objective:  Physical Exam  Constitutional: He is oriented to person, place, and time. He appears well-developed and well-nourished. He is active and cooperative. No distress.  BP 131/85 (BP Location: Right Arm, Patient Position: Sitting, Cuff Size: Small)   Pulse 83   Temp 98.4 F (36.9 C) (Oral)   Resp 16  Ht 5' 9.5" (1.765 m)   Wt 162 lb (73.5 kg)   SpO2 96%   BMI 23.58 kg/m   HENT:  Head: Normocephalic and atraumatic.  Right Ear: Hearing normal.  Left Ear: Hearing normal.  Eyes: Conjunctivae are normal. No scleral icterus.  Neck: Normal range of motion. Neck supple. No thyromegaly present.  Cardiovascular: Normal rate, regular rhythm and normal heart sounds.   Pulses:      Radial pulses are 2+ on the right side, and 2+ on the left side.  Pulmonary/Chest:  Effort normal and breath sounds normal.  Lymphadenopathy:       Head (right side): No tonsillar, no preauricular, no posterior auricular and no occipital adenopathy present.       Head (left side): No tonsillar, no preauricular, no posterior auricular and no occipital adenopathy present.    He has no cervical adenopathy.       Right: No supraclavicular adenopathy present.       Left: No supraclavicular adenopathy present.  Neurological: He is alert and oriented to person, place, and time.  Skin: Skin is warm, dry and intact. No rash noted. No cyanosis or erythema. Nails show no clubbing.  Psychiatric: He has a normal mood and affect. His speech is normal and behavior is normal.           Assessment & Plan:   1. Lumbar disc herniation with radiculopathy Continue current treatment and specialty follow-up.  - HYDROcodone-acetaminophen (NORCO) 5-325 MG tablet; Take 1 tablet by mouth every 8 (eight) hours as needed for moderate pain.  Dispense: 30 tablet; Refill: 0    No Follow-up on file.   Fernande Brashelle S. Zendayah Hardgrave, PA-C Primary Care at [facility]

## 2017-02-21 NOTE — Patient Instructions (Addendum)
Ask the therapist about HYPERMOBILITY of the lower back.  Consider [facility].  [facility] out [facility] for fun socks.    IF you received an x-ray today, you will receive an invoice from [facility]. Please contact [facility] at [phone] with questions or concerns regarding your invoice.   IF you received labwork today, you will receive an invoice from [facility]. Please contact [facility] at [phone] with questions or concerns regarding your invoice.   Our billing staff will not be able to assist you with questions regarding bills from these companies.  You will be contacted with the lab results as soon as they are available. The fastest way to get your results is to activate your My Chart account. Instructions are located on the last page of this paperwork. If you have not heard from us regarding the results in 2 weeks, please contact this office.

## 2017-03-11 NOTE — Progress Notes (Signed)
[name] [address] [place], [place] [address] Phone: [phone] Subjective:    I'm seeing this patient by the request  of:  Porfirio Oar, PA-C   CC: Back pain with radiculopathy   [id number]  Craig Thomas is a 31 y.o. male coming in with complaint of low back pain with radiculopathy. he has had this pain for quite some time. Patient has done many different modalities including integrative therapies. Patient has been taking gabapentin and then seemed to be more beneficial. Patient unfortunately states that the pain has worsened somewhat when he is used to cane for ambulation from time to time. Patient states the last 6 weeks he has not been removed work out. Doesn't some difficulty even throughout the day. Patient states that he can even wake him up at night. Seems be worse when he lays down flat. Sometimes seems to get better with activity but can sometimes can be severely painful. Rates the severity pain is 8 out of 10. Has been taking anti-inflammatories which seems to be somewhat beneficial.    Previous imaging: MRI of the lumbar spine was taken the fibular 17 2018. This was independently visualized by me. Small herniation L4-L5 likely Impinging bilateral L5 nerve roots.   Past Medical History:  Diagnosis Date  . Allergic    pet and enviromental  . Asthma   . 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 TONSILLECTOMY     Social History   Social History  . Marital status: Married    Spouse name: N/A  . Number of children: 0  . Years of education: N/A   Occupational History  . Network engineer   Social History Main Topics  . Smoking status: Never Smoker  . Smokeless tobacco: Never Used  . Alcohol use Yes     Comment: socially   . Drug use: No  . Sexual activity: Yes   Other Topics Concern  . Not on file   Social History Narrative  . No narrative  on file   No Known Allergies Family History  Problem Relation Age of Onset  . Adopted: Yes  . Heart disease      GF  . Heart disease Father     Past medical history, social, surgical and family history all reviewed in electronic medical record.  No pertanent information unless stated regarding to the chief complaint.   Review of Systems:Review of systems updated and as accurate as of 03/11/17  No headache, visual changes, nausea, vomiting, diarrhea, constipation, dizziness, abdominal pain, skin rash, fevers, chills, night sweats, weight loss, swollen lymph nodes, body aches, joint swelling, muscle aches, chest pain, shortness of breath, mood changes.   Objective  There were no vitals taken for this visit. Systems examined below as of 03/11/17   General: No apparent distress alert and oriented x3 mood and affect normal, dressed appropriately.  HEENT: Pupils equal, extraocular movements intact  Respiratory: Patient's speak in full sentences and does not appear short of breath  Cardiovascular: No lower extremity edema, non tender, no erythema  Skin: Warm dry intact with no signs of infection or rash on extremities or on axial skeleton.  Abdomen: Soft nontender  Neuro: Cranial nerves II through XII are intact, neurovascularly intact in all extremities with 2+ DTRs and 2+ pulses.  Lymph: No lymphadenopathy of posterior or anterior cervical chain or axillae bilaterally.  Gait normal with good balance and coordination.  MSK:  Non tender with full range of motion and good stability and symmetric strength and tone of shoulders, elbows, wrist, hip, knee and ankles bilaterally.  Back Exam:  Inspection: Unremarkable  Motion: Flexion 35 deg, Extension 15 deg, Side Bending to 25 deg bilaterally, Rotation to 35 deg bilaterally  SLR laying: Negative in tightness bilaterally and states that he does have tingling in the toes. XSLR laying: Negative  Palpable tenderness: Patient does have diffuse  tenderness of the paraspinal musculature of the lumbar spine bilaterally. No midline tenderness. FABER: Tightness bilaterally. Sensory change: Gross sensation intact to all lumbar and sacral dermatomes.  Reflexes: 2+ at both patellar tendons, 2+ at achilles tendons, Babinski's downgoing.  Strength at foot  Plantar-flexion: 5/5 Dorsi-flexion: 5/5 Eversion: 5/5 Inversion: 5/5  Leg strength  Quad: 5/5 Hamstring: 5/5 Hip flexor: 5/5 Hip abductors: 5/5  Gait unremarkable.  Procedure note 97110; 15 minutes spent for Therapeutic exercises as stated in above notes.  This included exercises focusing on stretching, strengthening, with significant focus on eccentric aspects.  Low back exercises that included:  Pelvic tilt/bracing instruction to focus on control of the pelvic girdle and lower abdominal muscles  Glute strengthening exercises, focusing on proper firing of the glutes without engaging the low back muscles Proper stretching techniques for maximum relief for the hamstrings, hip flexors, low back and some rotation where tolerated   Proper technique shown and discussed handout in great detail with ATC.  All questions were discussed and answered.      Impression and Recommendations:     This case required medical decision making of moderate complexity.      Note: This dictation was prepared with Dragon dictation along with smaller phrase technology. Any transcriptional errors that result from this process are unintentional.

## 2017-03-12 ENCOUNTER — Encounter: Payer: Self-pay | Admitting: Family Medicine

## 2017-03-12 ENCOUNTER — Ambulatory Visit (INDEPENDENT_AMBULATORY_CARE_PROVIDER_SITE_OTHER): Payer: Self-pay | Admitting: Family Medicine

## 2017-03-12 ENCOUNTER — Other Ambulatory Visit (INDEPENDENT_AMBULATORY_CARE_PROVIDER_SITE_OTHER): Payer: Self-pay

## 2017-03-12 VITALS — BP 137/90 | HR 72 | Temp 98.6°F | Resp 18 | Ht 69.5 in | Wt 165.0 lb

## 2017-03-12 VITALS — BP 120/82 | HR 66 | Resp 16 | Wt 166.2 lb

## 2017-03-12 DIAGNOSIS — M5116 Intervertebral disc disorders with radiculopathy, lumbar region: Secondary | ICD-10-CM

## 2017-03-12 DIAGNOSIS — R21 Rash and other nonspecific skin eruption: Secondary | ICD-10-CM

## 2017-03-12 DIAGNOSIS — M2550 Pain in unspecified joint: Secondary | ICD-10-CM

## 2017-03-12 DIAGNOSIS — N5082 Scrotal pain: Secondary | ICD-10-CM

## 2017-03-12 DIAGNOSIS — N429 Disorder of prostate, unspecified: Secondary | ICD-10-CM

## 2017-03-12 DIAGNOSIS — M6289 Other specified disorders of muscle: Secondary | ICD-10-CM

## 2017-03-12 LAB — CBC WITH DIFFERENTIAL/PLATELET
BASOS PCT: 0.7 % (ref 0.0–3.0)
Basophils Absolute: 0 10*3/uL (ref 0.0–0.1)
EOS PCT: 3.9 % (ref 0.0–5.0)
Eosinophils Absolute: 0.2 10*3/uL (ref 0.0–0.7)
HEMATOCRIT: 46.5 % (ref 39.0–52.0)
Hemoglobin: 16.3 g/dL (ref 13.0–17.0)
LYMPHS PCT: 29.5 % (ref 12.0–46.0)
Lymphs Abs: 1.9 10*3/uL (ref 0.7–4.0)
MCHC: 35 g/dL (ref 30.0–36.0)
MCV: 90.5 fl (ref 78.0–100.0)
Monocytes Absolute: 0.5 10*3/uL (ref 0.1–1.0)
Monocytes Relative: 8.2 % (ref 3.0–12.0)
NEUTROS ABS: 3.7 10*3/uL (ref 1.4–7.7)
Neutrophils Relative %: 57.7 % (ref 43.0–77.0)
PLATELETS: 219 10*3/uL (ref 150.0–400.0)
RBC: 5.14 Mil/uL (ref 4.22–5.81)
RDW: 13.7 % (ref 11.5–15.5)
WBC: 6.4 10*3/uL (ref 4.0–10.5)

## 2017-03-12 LAB — IRON: IRON: 83 ug/dL (ref 42–165)

## 2017-03-12 LAB — VITAMIN D 25 HYDROXY (VIT D DEFICIENCY, FRACTURES): VITD: 25.36 ng/mL — AB (ref 30.00–100.00)

## 2017-03-12 LAB — SEDIMENTATION RATE: Sed Rate: 1 mm/hr (ref 0–15)

## 2017-03-12 LAB — MAGNESIUM: Magnesium: 2 mg/dL (ref 1.5–2.5)

## 2017-03-12 LAB — PHOSPHORUS: Phosphorus: 2.7 mg/dL (ref 2.3–4.6)

## 2017-03-12 LAB — TSH: TSH: 1.52 u[IU]/mL (ref 0.35–4.50)

## 2017-03-12 MED ORDER — FLUCONAZOLE 100 MG PO TABS: 100.0000 mg | ORAL_TABLET | Freq: Every day | ORAL | 0 refills | Status: DC

## 2017-03-12 MED ORDER — DOXYCYCLINE HYCLATE 100 MG PO TABS: 100.0000 mg | ORAL_TABLET | Freq: Two times a day (BID) | ORAL | 0 refills | Status: DC

## 2017-03-12 MED ORDER — PREDNISONE 50 MG PO TABS: 50.0000 mg | ORAL_TABLET | Freq: Every day | ORAL | 0 refills | Status: DC

## 2017-03-12 NOTE — Progress Notes (Signed)
Chief Complaint  Patient presents with  . Follow-up    hernia    HPI   He reports that he has gone to Psychotherapy He is also going to [employer] [facility] 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  He reports that he is getting dry needling He is getting a big dose of prednisone for 5 days to help relax the groin muscles.   He reports that his physical therapy is helping his groin, leg pain and the numbness he was having.   Past Medical History:  Diagnosis Date  . Allergic    pet and enviromental  . Asthma   . 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 albuterol (PROVENTIL HFA;VENTOLIN HFA) 108 (90 BASE) MCG/ACT inhaler Inhale 2 puffs into the lungs every 6 (six) hours as needed for wheezing.    . beclomethasone (QVAR) 80 MCG/ACT inhaler Inhale 2 puffs into the lungs 2 (two) times daily. (Patient taking differently: Inhale 1 puff into the lungs daily. ) 1 Inhaler 6  . cetirizine (ZYRTEC) 10 MG tablet Take 10 mg by mouth daily.    . DULoxetine HCl (CYMBALTA PO) Take by mouth.    . gabapentin (NEURONTIN) 400 MG capsule Take 400 mg by mouth 3 (three) times daily.    Marland Kitchen HYDROcodone-acetaminophen (NORCO) 5-325 MG tablet Take 1 tablet by mouth every 8 (eight) hours as needed for moderate pain. 30 tablet 0  . ibuprofen (ADVIL,MOTRIN) 200 MG tablet Take 400 mg by mouth every 6 (six) hours as needed for moderate pain.    . Multiple Vitamin (MULTIVITAMIN WITH MINERALS) TABS tablet Take 1 tablet by mouth daily.    . naproxen (NAPROSYN) 500 MG tablet Take 1 tablet (500 mg total) by mouth 2 (two) times daily. 60 tablet 0  . predniSONE (DELTASONE) 50 MG tablet Take 1 tablet (50 mg total) by mouth daily. 5  tablet 0  . tiZANidine (ZANAFLEX) 4 MG tablet TAKE 1/2-1 TABLET BY MOUTH AT BEDTIME AS NEEDED FOR SLEEP/RELAXATION  0  . doxycycline (VIBRA-TABS) 100 MG tablet Take 1 tablet (100 mg total) by mouth 2 (two) times daily. 20 tablet 0  . fluconazole (DIFLUCAN) 100 MG tablet Take 1 tablet (100 mg total) by mouth daily. 7 tablet 0   No current facility-administered medications for this visit.     Allergies: No Known Allergies  Past Surgical History:  Procedure Laterality Date  . TONSILLECTOMY      Social History   Social History  . Marital status: Married    Spouse name: N/A  . Number of children: 0  . Years of education: N/A   Occupational History  . Network engineer   Social History Main Topics  . Smoking status: Never Smoker  . Smokeless tobacco: Never Used  . Alcohol use Yes     Comment: socially   . Drug use: No  . Sexual activity: Yes   Other Topics Concern  . None   Social History Narrative  . None    Review of Systems  Constitutional: Negative for chills and fever.  Cardiovascular: Negative for chest pain.  Gastrointestinal: Negative for abdominal pain, nausea and  vomiting.  Skin: Negative for itching and rash.  Neurological: Negative for dizziness and headaches.    Objective: Vitals:   03/12/17 1525  BP: 137/90  Pulse: 72  Resp: 18  Temp: 98.6 F (37 C)  TempSrc: Oral  SpO2: 99%  Weight: 165 lb (74.8 kg)  Height: 5' 9.5" (1.765 m)    Physical Exam  Constitutional: He is oriented to person, place, and time. He appears well-developed and well-nourished.  HENT:  Head: Normocephalic and atraumatic.  Pulmonary/Chest: Effort normal.  Neurological: He is alert and oriented to person, place, and time.   GU exam Erythema of the scrotum  Without penile discharge No swelling No LAD  Assessment and Plan Law was seen today for follow-up.  Diagnoses and all orders for this visit:  Penile rash Lumbar disc herniation with  radiculopathy Scrotal pain Muscle tightness Continue current meds Prostate irregularity Will add a PSA   Will treat empirically for potential for prostate infection and scrotal yeast infection -     fluconazole (DIFLUCAN) 100 MG tablet; Take 1 tablet (100 mg total) by mouth daily. -     doxycycline (VIBRA-TABS) 100 MG tablet; Take 1 tablet (100 mg total) by mouth 2 (two) times daily.     Zoe A Stallings

## 2017-03-12 NOTE — Progress Notes (Signed)
Pre-visit discussion using our clinic review tool. No additional management support is needed unless otherwise documented below in the visit note.  

## 2017-03-12 NOTE — Patient Instructions (Signed)
Good to see you.  Ice 20 minutes 2 times daily. Usually after activity and before bed. Exercises 3 times a week.  Prednisone daily for 5 days We will get EMG of the legs to see if it shows anything.  We also will get some labs today which I think will do well.  See me again after EMG and we will discuss what is next

## 2017-03-12 NOTE — Patient Instructions (Signed)
     IF you received an x-ray today, you will receive an invoice from [facility]. Please contact [facility] at [phone] with questions or concerns regarding your invoice.   IF you received labwork today, you will receive an invoice from [facility]. Please contact [facility] at [phone] with questions or concerns regarding your invoice.   Our billing staff will not be able to assist you with questions regarding bills from these companies.  You will be contacted with the lab results as soon as they are available. The fastest way to get your results is to activate your My Chart account. Instructions are located on the last page of this paperwork. If you have not heard from us regarding the results in 2 weeks, please contact this office.     

## 2017-03-12 NOTE — Assessment & Plan Note (Signed)
Patient is having radicular symptoms. Nothing seems to be out of proportion at this time. Discussed different treatment options. I do not believe that the amount of nerve impingement and likely give him being consistent pain is having at this point. L5 nerve root impingement she gives him more of a lateral aspect the leg pain and patient is complaining more medially. We will get certain lambs to make sure there is no other organic causes some seeing chronic pain. Patient will also have an EMG to make sure everything is appropriate. If there is any decrease in amplitude of the L5 nerve roots we'll consider potential nerve root injections at that time. Depending on findings and we'll have patient follow-up for further treatment options.

## 2017-03-13 ENCOUNTER — Encounter: Payer: Self-pay | Admitting: Neurology

## 2017-03-13 ENCOUNTER — Other Ambulatory Visit: Payer: Self-pay | Admitting: Family Medicine

## 2017-03-13 LAB — RHEUMATOID FACTOR

## 2017-03-13 LAB — PSA: Prostate Specific Ag, Serum: 0.6 ng/mL (ref 0.0–4.0)

## 2017-03-13 LAB — ANA: ANA: NEGATIVE

## 2017-03-13 MED ORDER — VITAMIN D (ERGOCALCIFEROL) 1.25 MG (50000 UNIT) PO CAPS: 50000.0000 [IU] | ORAL_CAPSULE | ORAL | 0 refills | Status: DC

## 2017-03-13 NOTE — Progress Notes (Unsigned)
So labs look good except vitamin D I would like you near 50 and you are at 25. We will send in a once weekly prescription dose of vitamin D and could help within 2 weeks.

## 2017-03-14 ENCOUNTER — Other Ambulatory Visit: Payer: Self-pay | Admitting: *Deleted

## 2017-03-14 DIAGNOSIS — M79605 Pain in left leg: Secondary | ICD-10-CM

## 2017-03-14 DIAGNOSIS — M79604 Pain in right leg: Secondary | ICD-10-CM

## 2017-03-19 ENCOUNTER — Ambulatory Visit (INDEPENDENT_AMBULATORY_CARE_PROVIDER_SITE_OTHER): Payer: Self-pay | Admitting: Family Medicine

## 2017-03-19 VITALS — BP 166/96 | HR 100 | Temp 99.5°F | Resp 18 | Ht 69.5 in | Wt 160.4 lb

## 2017-03-19 DIAGNOSIS — N4829 Other inflammatory disorders of penis: Secondary | ICD-10-CM

## 2017-03-19 DIAGNOSIS — N4889 Other specified disorders of penis: Secondary | ICD-10-CM

## 2017-03-19 DIAGNOSIS — N501 Vascular disorders of male genital organs: Secondary | ICD-10-CM

## 2017-03-19 NOTE — Progress Notes (Signed)
Chief Complaint  Patient presents with  . Penis Pain    inflammation and bleeding    HPI  Patient with penile inflammation and starting at this morning he noted bleeding from the tip of his penis. States that he did not do anything differently.  States that he was getting better relief from his back and woke up on his stomach. States that he has some inflammation He saw [facility] and they told him to apply diaper ointment. States that he was referred to [facility] for catheterization to rule out internal stricture.  Past Medical History:  Diagnosis Date  . Allergic    pet and enviromental  . Asthma   . 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 albuterol (PROVENTIL HFA;VENTOLIN HFA) 108 (90 BASE) MCG/ACT inhaler Inhale 2 puffs into the lungs every 6 (six) hours as needed for wheezing.    . beclomethasone (QVAR) 80 MCG/ACT inhaler Inhale 2 puffs into the lungs 2 (two) times daily. (Patient taking differently: Inhale 1 puff into the lungs daily. ) 1 Inhaler 6  . cetirizine (ZYRTEC) 10 MG tablet Take 10 mg by mouth daily.    Marland Kitchen doxycycline (VIBRA-TABS) 100 MG tablet Take 1 tablet (100 mg total) by mouth 2 (two) times daily. 20 tablet 0  . DULoxetine HCl (CYMBALTA PO) Take by mouth.    . gabapentin (NEURONTIN) 400 MG capsule Take 400 mg by mouth 3 (three) times daily.    Marland Kitchen HYDROcodone-acetaminophen (NORCO) 5-325 MG tablet Take 1 tablet by mouth every 8 (eight) hours as needed for moderate pain. 30 tablet 0  . ibuprofen (ADVIL,MOTRIN) 200 MG tablet Take 400 mg by mouth every 6 (six) hours as needed for moderate pain.    . Multiple Vitamin (MULTIVITAMIN WITH MINERALS) TABS tablet Take 1 tablet by mouth daily.    . naproxen (NAPROSYN) 500 MG tablet Take 1 tablet (500 mg total) by mouth 2 (two) times daily. 60 tablet 0  . tiZANidine (ZANAFLEX) 4 MG tablet TAKE 1/2-1 TABLET BY MOUTH AT BEDTIME  AS NEEDED FOR SLEEP/RELAXATION  0  . Vitamin D, Ergocalciferol, (DRISDOL) 50000 units CAPS capsule Take 1 capsule (50,000 Units total) by mouth every 7 (seven) days. 12 capsule 0   No current facility-administered medications for this visit.     Allergies: No Known Allergies  Past Surgical History:  Procedure Laterality Date  . TONSILLECTOMY      Social History   Social History  . Marital status: Married    Spouse name: N/A  . Number of children: 0  . Years of education: N/A   Occupational History  . Network engineer   Social History Main Topics  . Smoking status: Never Smoker  . Smokeless tobacco: Never Used  . Alcohol use Yes     Comment: socially   . Drug use: No  . Sexual activity: Yes   Other Topics Concern  . None   Social History Narrative  . None    ROS  Objective: Vitals:   03/19/17 1340  BP: (!) 166/96  Pulse: 100  Resp: 18  Temp: 99.5 F (37.5 C)  TempSrc: Oral  SpO2: 98%  Weight: 160 lb 6.4 oz (72.8 kg)  Height: 5' 9.5" (1.765 m)    Physical Exam  With retraction of the foreskin there were visible tears No abnormalities to the urethral meatus    Assessment and  Plan Dragon was seen today for penis pain.  Diagnoses and all orders for this visit:  Foreskin inflammation Foreskin fissure Bleeding of penis  Advised to continue diaper ointment to form a barrier protection Pt will see [facility] tomorrow for additional evaluation to rule out internal causes      Najib Colmenares A Creta Levin

## 2017-03-25 ENCOUNTER — Ambulatory Visit (INDEPENDENT_AMBULATORY_CARE_PROVIDER_SITE_OTHER): Payer: Self-pay | Admitting: Physician Assistant

## 2017-03-25 ENCOUNTER — Encounter: Payer: Self-pay | Admitting: Physician Assistant

## 2017-03-25 VITALS — BP 134/85 | HR 73 | Temp 98.3°F | Resp 16 | Ht 69.0 in | Wt 160.0 lb

## 2017-03-25 DIAGNOSIS — R21 Rash and other nonspecific skin eruption: Secondary | ICD-10-CM

## 2017-03-25 DIAGNOSIS — M5116 Intervertebral disc disorders with radiculopathy, lumbar region: Secondary | ICD-10-CM

## 2017-03-25 MED ORDER — CEPHALEXIN 500 MG PO CAPS: 500.0000 mg | ORAL_CAPSULE | Freq: Four times a day (QID) | ORAL | 0 refills | Status: AC

## 2017-03-25 MED ORDER — HYDROCODONE-ACETAMINOPHEN 5-325 MG PO TABS: 1.0000 | ORAL_TABLET | Freq: Four times a day (QID) | ORAL | 0 refills | Status: DC | PRN

## 2017-03-25 NOTE — Assessment & Plan Note (Signed)
He is concerned for cellulitis, not treated by previous antibiotics (I think he's received a total of 3 rounds of doxycycline). Trial of cephalexin. Proceed with dermatology for biopsy. Will see if a local office can get him is sooner, or Dr. Kathyrn Sheriff in [place].

## 2017-03-25 NOTE — Patient Instructions (Addendum)
Try Balmex.    IF you received an x-ray today, you will receive an invoice from [facility]. Please contact [facility] at [phone] with questions or concerns regarding your invoice.   IF you received labwork today, you will receive an invoice from [facility]. Please contact [facility] at [phone] with questions or concerns regarding your invoice.   Our billing staff will not be able to assist you with questions regarding bills from these companies.  You will be contacted with the lab results as soon as they are available. The fastest way to get your results is to activate your My Chart account. Instructions are located on the last page of this paperwork. If you have not heard from [place] regarding the results in 2 weeks, please contact this office.

## 2017-03-25 NOTE — Assessment & Plan Note (Signed)
Continue treatments and follow-up with PT and Dr. Terrilee Files.

## 2017-03-25 NOTE — Progress Notes (Signed)
Patient ID: Craig Thomas, male    DOB: [birth year], 31 y.o.   MRN: [id number]  PCP: Porfirio Oar, PA-C  Chief Complaint  Patient presents with  . Urology results    patient is here to discuss urology results;     Subjective:   Presents for evaluation of persistent back pain and penile rash.  See previous notes for details of this complicated situation.  1. Penile lesion started bleeding last week. He saw [facility] again, and was advised to use baby diaper ointment.  The bleeding is improved, but the rash and pain persisted. Requires more hydrocodone for management. Ended up seeing a urologist at [facility], Dr. Earlene Plater, who advised biopsy and referred him to dermatology. That visit is scheduled for the 2nd week of June. He wonders if a local dermatologist could see him sooner.  2. Has seen Dr. Terrilee Files regarding his back pain. The supposition is that the penile rash caused him to hold his body differently, primarily tighter, and that in combination with hypermobility and herniated disc, has led to the pain in the back and legs. The steroids helped the low back considerably. Doing well on Cymbalta. Has reduced the gabapentin down to 300 mg TID, without worsening of his pain. PT continues. No recurrent testicular pain. He's walking better. Sitting and lying for long periods remains painful. EMG is scheduled for next week, due to muscle atrophy of the calf.   Review of Systems As above.    Patient Active Problem List   Diagnosis Date Noted  . Lumbar disc herniation with radiculopathy 01/16/2017  . Rash and nonspecific skin eruption 02/20/2013  . Asthma      Prior to Admission medications   Medication Sig Start Date End Date Taking? Authorizing Provider  albuterol (PROVENTIL HFA;VENTOLIN HFA) 108 (90 BASE) MCG/ACT inhaler Inhale 2 puffs into the lungs every 6 (six) hours as needed for wheezing.   Yes Historical Provider, MD  beclomethasone (QVAR) 80  MCG/ACT inhaler Inhale 2 puffs into the lungs 2 (two) times daily. Patient taking differently: Inhale 1 puff into the lungs daily.  02/20/13  Yes Wanda Plump, MD  cetirizine (ZYRTEC) 10 MG tablet Take 10 mg by mouth daily.   Yes Historical Provider, MD  DULoxetine HCl (CYMBALTA PO) Take by mouth.   Yes Historical Provider, MD  gabapentin (NEURONTIN) 400 MG capsule Take 400 mg by mouth 3 (three) times daily.   Yes Historical Provider, MD  ibuprofen (ADVIL,MOTRIN) 200 MG tablet Take 400 mg by mouth every 6 (six) hours as needed for moderate pain.   Yes Historical Provider, MD  Multiple Vitamin (MULTIVITAMIN WITH MINERALS) TABS tablet Take 1 tablet by mouth daily.   Yes Historical Provider, MD  naproxen (NAPROSYN) 500 MG tablet Take 1 tablet (500 mg total) by mouth 2 (two) times daily. 01/16/17  Yes Sharry Beining, PA-C  tiZANidine (ZANAFLEX) 4 MG tablet TAKE 1/2-1 TABLET BY MOUTH AT BEDTIME AS NEEDED FOR SLEEP/RELAXATION 03/05/17  Yes Historical Provider, MD  Vitamin D, Ergocalciferol, (DRISDOL) 50000 units CAPS capsule Take 1 capsule (50,000 Units total) by mouth every 7 (seven) days. 03/13/17  Yes Judi Saa, DO     No Known Allergies     Objective:  Physical Exam  Constitutional: He is oriented to person, place, and time. He appears well-developed and well-nourished. He is active and cooperative. No distress.  BP 134/85   Pulse 73   Temp 98.3 F (36.8 C) (Oral)   Resp  16   Ht  (1.753 m)   Wt 160 lb (72.6 kg)   SpO2 98%   BMI 23.63 kg/m    Eyes: Conjunctivae are normal.  Pulmonary/Chest: Effort normal.  Genitourinary:    Penile erythema and penile tenderness present. No discharge found.  Genitourinary Comments: RIGHT distal penile shaft and just along the corona, the skin is erythematous. Resolving lesion consistent with recent bleeding noted on the LEFT. No other discrete lesions. No drainage. No induration.  Neurological: He is alert and oriented to person, place, and  time.  Psychiatric: He has a normal mood and affect. His speech is normal and behavior is normal.           Assessment & Plan:   Problem List Items Addressed This Visit    Rash and nonspecific skin eruption - Primary    He is concerned for cellulitis, not treated by previous antibiotics (I think he's received a total of 3 rounds of doxycycline). Trial of cephalexin. Proceed with dermatology for biopsy. Will see if a local office can get him is sooner, or Dr. Kathyrn Sheriff in [place].      Relevant Medications   cephALEXin (KEFLEX) 500 MG capsule   Other Relevant Orders   Ambulatory referral to Dermatology   Lumbar disc herniation with radiculopathy    Continue treatments and follow-up with PT and Dr. Terrilee Files.      Relevant Medications   HYDROcodone-acetaminophen (NORCO) 5-325 MG tablet       Return if symptoms worsen or fail to improve.   Fernande Bras, PA-C Primary Care at [facility]

## 2017-04-02 ENCOUNTER — Ambulatory Visit (INDEPENDENT_AMBULATORY_CARE_PROVIDER_SITE_OTHER): Payer: Self-pay | Admitting: Neurology

## 2017-04-02 DIAGNOSIS — M79604 Pain in right leg: Secondary | ICD-10-CM

## 2017-04-02 DIAGNOSIS — M79605 Pain in left leg: Secondary | ICD-10-CM

## 2017-04-02 NOTE — Procedures (Signed)
[facility]  [address], [address]  [place], [place] [address] Tel: [phone] Fax:  [phone] Test Date:  04/02/2017  Patient: Craig MannsMichael Rusnak DOB: September 03, 1986 Physician: Nita Sickleonika Patel, DO  Sex: Male Height: 5\' 9"  Ref Phys: Antoine PrimasZachary Smith, D.O.  ID#: [id number] Temp: 33.2C Technician:    Patient Complaints: This is a 31 year-old gentleman referred for evaluation of bilateral radicular leg pain.  NCV & EMG Findings: 1. Bilateral sural and superficial peroneal sensory responses are within normal limits.  2. Bilateral peroneal and tibial motor responses are within normal limits.  3. Bilateral tibial H reflex studies are within normal limits.  4. There is no evidence of active or chronic motor axon loss changes affecting any of the tested muscles. Motor unit recruitment and configuration is within normal limits.   Impression: This is a normal study of the lower extremities.   In particular, there is no evidence of a sensorimotor polyneuropathy or lumbosacral radiculopathy.   ___________________________ Nita Sickleonika Patel, DO    Nerve Conduction Studies Anti Sensory Summary Table   Site NR Peak (ms) Norm Peak (ms) P-T Amp (V) Norm P-T Amp  Left Sup Peroneal Anti Sensory (Ant Lat Mall)  33.2C  12 cm    3.2 <4.5 19.8 >5  Right Sup Peroneal Anti Sensory (Ant Lat Mall)  33.2C  12 cm    2.7 <4.5 14.7 >5  Left Sural Anti Sensory (Lat Mall)  33.2C  Calf    3.8 <4.5 16.8 >5  Right Sural Anti Sensory (Lat Mall)  33.2C  Calf    3.5 <4.5 15.8 >5   Motor Summary Table   Site NR Onset (ms) Norm Onset (ms) O-P Amp (mV) Norm O-P Amp Site1 Site2 Delta-0 (ms) Dist (cm) Vel (m/s) Norm Vel (m/s)  Left Peroneal Motor (Ext Dig Brev)  33.2C  Ankle    5.0 <5.5 5.7 >3 B Fib Ankle 6.6 33.0 50 >40  B Fib    11.6  5.8  Poplt B Fib 2.2 10.0 45 >40  Poplt    13.8  5.7         Right Peroneal Motor (Ext Dig Brev)  33.2C  Ankle    3.5 <5.5 7.3 >3 B Fib Ankle 6.9 32.0 46 >40  B Fib     10.4  7.1  Poplt B Fib 1.3 7.0 54 >40  Poplt    11.7  6.9         Left Peroneal TA Motor (Tib Ant)  33.2C  Fib Head    2.3 <4.0 6.6 >4 Poplit Fib Head 1.6 7.0 44 >40  Poplit    3.9  6.1         Right Peroneal TA Motor (Tib Ant)  33.2C  Fib Head    2.3 <4.0 6.1 >4 Poplit Fib Head 1.2 7.0 58 >40  Poplit    3.5  6.1         Left Tibial Motor (Abd Hall Brev)  33.2C  Ankle    3.6 <6.0 14.0 >8 Knee Ankle 7.2 40.0 56 >40  Knee    10.8  10.0         Right Tibial Motor (Abd Hall Brev)  33.2C  Ankle    3.3 <6.0 16.1 >8 Knee Ankle 7.2 40.0 56 >40  Knee    10.5  12.5          H Reflex Studies   NR H-Lat (ms) Lat Norm (ms) L-R H-Lat (ms)  Left Tibial (  Gastroc)  33.2C     30.61 <35 0.00  Right Tibial (Gastroc)  33.2C     30.61 <35 0.00   EMG   Side Muscle Ins Act Fibs Psw Fasc Number Recrt Dur Dur. Amp Amp. Poly Poly. Comment  Right AntTibialis Nml Nml Nml Nml Nml Nml Nml Nml Nml Nml Nml Nml N/A  Right Gastroc Nml Nml Nml Nml Nml Nml Nml Nml Nml Nml Nml Nml N/A  Right Flex Dig Long Nml Nml Nml Nml Nml Nml Nml Nml Nml Nml Nml Nml N/A  Right RectFemoris Nml Nml Nml Nml Nml Nml Nml Nml Nml Nml Nml Nml N/A  Right GluteusMed Nml Nml Nml Nml Nml Nml Nml Nml Nml Nml Nml Nml N/A  Left AntTibialis Nml Nml Nml Nml Nml Nml Nml Nml Nml Nml Nml Nml N/A  Left Gastroc Nml Nml Nml Nml Nml Nml Nml Nml Nml Nml Nml Nml N/A  Left Flex Dig Long Nml Nml Nml Nml Nml Nml Nml Nml Nml Nml Nml Nml N/A  Left RectFemoris Nml Nml Nml Nml Nml Nml Nml Nml Nml Nml Nml Nml N/A  Left GluteusMed Nml Nml Nml Nml Nml Nml Nml Nml Nml Nml Nml Nml N/A  Left Lumbo Parasp Low Nml Nml Nml Nml NE - - - - - - - N/A      Waveforms:

## 2017-04-04 ENCOUNTER — Encounter: Payer: Self-pay | Admitting: Family Medicine

## 2017-04-04 ENCOUNTER — Telehealth: Payer: Self-pay | Admitting: Physician Assistant

## 2017-04-04 NOTE — Telephone Encounter (Signed)
THIS MESSAGE IS FROM DR. CHRISTY ADIGUN TO CHELLE: CHELLE - DR. ADIGUN STATES YOU REFERRED THIS PATIENT TO SEE HER AND SHE WOULD LIKE TO DISCUSS WITH YOU HER FINDINGS. BEST PHONE [phone]  MBC

## 2017-04-05 NOTE — Telephone Encounter (Signed)
Spoke with Dr. Kathyrn SheriffAdigun, who recently saw this patient.  Biopsy of the most active area of the penile lesion is pending. 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 Lichen sclerosis also possible, but is usually seen in older age group. Level of pain is the only curious part, as lichen planus isn't known to cause the level of pain that he is having. Once biopsy results are back, she will initiate treatment remotely, and then plan follow-up in 6 weeks. Once treatment is working well, he can follow-up with PCP.

## 2017-04-17 ENCOUNTER — Encounter: Payer: Self-pay | Admitting: Physician Assistant

## 2017-04-17 DIAGNOSIS — N481 Balanitis: Secondary | ICD-10-CM

## 2017-04-17 NOTE — Progress Notes (Signed)
Fax notes from [facility], Dr. Beatrix Fettershris Adigun. Biopsy reveals that penile lesion is most likely a fixed drug reaction. Advised patient to stop meloxicam. Will follow-up in June.

## 2017-04-19 ENCOUNTER — Ambulatory Visit (INDEPENDENT_AMBULATORY_CARE_PROVIDER_SITE_OTHER): Payer: Self-pay | Admitting: Physician Assistant

## 2017-04-19 ENCOUNTER — Encounter: Payer: Self-pay | Admitting: Physician Assistant

## 2017-04-19 VITALS — BP 129/82 | HR 77 | Temp 97.8°F | Resp 18 | Ht 68.58 in | Wt 162.0 lb

## 2017-04-19 DIAGNOSIS — N481 Balanitis: Secondary | ICD-10-CM

## 2017-04-19 DIAGNOSIS — M5116 Intervertebral disc disorders with radiculopathy, lumbar region: Secondary | ICD-10-CM

## 2017-04-19 MED ORDER — HYDROCODONE-ACETAMINOPHEN 5-325 MG PO TABS: 1.0000 | ORAL_TABLET | Freq: Four times a day (QID) | ORAL | 0 refills | Status: DC | PRN

## 2017-04-19 MED ORDER — TIZANIDINE HCL 4 MG PO TABS: ORAL_TABLET | ORAL | 0 refills | Status: DC

## 2017-04-19 NOTE — Assessment & Plan Note (Signed)
Continue with aggressive PT. Contact Dr. Beatrix Fettershris Adigun and Dr. Terrilee FilesZach Smith regarding their recommendations for anti-inflammatories that would not contribute to the fixed drug reaction. ?Turmeric? ?Cherry Juice? If those are not appropriate, or ineffective, we could 1. INCREASE duloxetine dose, or 2. Change gabapentin to the extended-release product and dry increasing the dose in hopes of reducing the pain fog he experiences on higher doses of the immediate release product.

## 2017-04-19 NOTE — Assessment & Plan Note (Signed)
Biopsy reveals fixed drug reaction is the most likely cause. Remain off NSAIDS. He'll ask Dr. Kathyrn SheriffAdigun if naturopathic anti-inflammatories would be acceptable in addressing his back pain. Follow-up with Dr. Kathyrn SheriffAdigun mid-June, as planned.

## 2017-04-19 NOTE — Progress Notes (Signed)
Patient ID: Craig Thomas, male    DOB: [birth year], 31 y.o.   MRN: [id number]  PCP: Porfirio Oar, PA-C  Chief Complaint  Patient presents with  . Follow-up    on back pain  . Medication Refill    Hydrocdone-acetaminophen    Subjective:   Presents for evaluation of back pain due to lumbar disc herniation and penile lesion.  He was seen by Dr. 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 Biopsy revealed changes most consistent with fixed drug reaction. As that is most commonly caused by NSAIDS, he has stopped them. Penile lesion is improving some, more slowly than he'd like, but he is not having the terrible pain he was previously.   With the elimination of NSAIDS, he's having more back pain. Muscle spasm and twitching has worsened without NSAIDS. Reduced sensation of the groin, by estimated 15-20%. Was not needing hydrocodone as often, taking 1/2 tablet 2-3 times daily, but is worried that when it "flares up," he'll have to take more. PT is more aggressive-cupping, dry needling, massage.  Tingling in the LEFT leg is resolved. Gaining more strength back to the calf muscle. They are working on a plan to get him back to running in 5 months, which will be 1 year after the onset of his symptoms. Work is flexible, allowing him to leave early if needed..  Gabapentin 300 mg TID, down from 400 mg. He reduced it when he returned to work, feeling brain fog at the higher dose.   Review of Systems As above.    Patient Active Problem List   Diagnosis Date Noted  . Pelvic floor dysfunction 02/13/2017  . Lumbar disc herniation with radiculopathy 01/16/2017  . Balanitis 02/20/2013  . Asthma      Prior to Admission medications   Medication Sig Start Date End Date Taking? Authorizing Provider  albuterol (PROVENTIL HFA;VENTOLIN HFA) 108 (90 BASE) MCG/ACT inhaler Inhale 2 puffs into the lungs every 6 (six) hours as needed for wheezing.   Yes [provider]    beclomethasone (QVAR) 80 MCG/ACT inhaler Inhale 2 puffs into the lungs 2 (two) times daily. Patient taking differently: Inhale 1 puff into the lungs daily.  02/20/13  Yes Paz, Nolon Rod, MD  cetirizine (ZYRTEC) 10 MG tablet Take 10 mg by mouth daily.   Yes [provider]  DULoxetine HCl (CYMBALTA PO) Take by mouth.   Yes [provider]  gabapentin (NEURONTIN) 400 MG capsule Take 400 mg by mouth 3 (three) times daily.   Yes [provider]  HYDROcodone-acetaminophen (NORCO) 5-325 MG tablet Take 1 tablet by mouth every 6 (six) hours as needed. 03/25/17  Yes Dorin Stooksbury, PA-C  Multiple Vitamin (MULTIVITAMIN WITH MINERALS) TABS tablet Take 1 tablet by mouth daily.   Yes [provider]  tiZANidine (ZANAFLEX) 4 MG tablet TAKE 1/2-1 TABLET BY MOUTH AT BEDTIME AS NEEDED FOR SLEEP/RELAXATION 03/05/17  Yes [provider]  Vitamin D, Ergocalciferol, (DRISDOL) 50000 units CAPS capsule Take 1 capsule (50,000 Units total) by mouth every 7 (seven) days. 03/13/17  Yes Judi Saa, DO     Allergies  Allergen Reactions  . Ibuprofen        Objective:  Physical Exam  Constitutional: He is oriented to person, place, and time. He appears well-developed and well-nourished. He is active and cooperative. No distress.  BP 129/82 (BP Location: Right Arm, Patient Position: Sitting, Cuff Size: Small)   Pulse 77   Temp 97.8 F (36.6  C) (Oral)   Resp 18   Ht 5' 8.58" (1.742 m)   Wt 162 lb (73.5 kg)   SpO2 99%   BMI 24.22 kg/m   HENT:  Head: Normocephalic and atraumatic.  Right Ear: Hearing normal.  Left Ear: Hearing normal.  Eyes: Conjunctivae are normal. No scleral icterus.  Neck: Normal range of motion. Neck supple. No thyromegaly present.  Cardiovascular: Normal rate, regular rhythm and normal heart sounds.   Pulses:      Radial pulses are 2+ on the right side, and 2+ on the left side.  Pulmonary/Chest: Effort normal and breath sounds normal.   Musculoskeletal:       Cervical back: Normal.       Thoracic back: He exhibits tenderness, pain and spasm. He exhibits normal range of motion, no bony tenderness, no swelling, no edema, no deformity, no laceration and normal pulse.       Lumbar back: He exhibits decreased range of motion, tenderness, bony tenderness, pain and spasm. He exhibits no swelling, no edema, no deformity, no laceration and normal pulse.  Lymphadenopathy:       Head (right side): No tonsillar, no preauricular, no posterior auricular and no occipital adenopathy present.       Head (left side): No tonsillar, no preauricular, no posterior auricular and no occipital adenopathy present.    He has no cervical adenopathy.       Right: No supraclavicular adenopathy present.       Left: No supraclavicular adenopathy present.  Neurological: He is alert and oriented to person, place, and time. No sensory deficit.  Skin: Skin is warm, dry and intact. No rash noted. No cyanosis or erythema. Nails show no clubbing.  Psychiatric: He has a normal mood and affect. His speech is normal and behavior is normal.           Assessment & Plan:   Problem List Items Addressed This Visit    Balanitis    Biopsy reveals fixed drug reaction is the most likely cause. Remain off NSAIDS. He'll ask Dr. Kathyrn SheriffAdigun if naturopathic anti-inflammatories would be acceptable in addressing his back pain. Follow-up with Dr. Kathyrn SheriffAdigun mid-June, as planned.      Lumbar disc herniation with radiculopathy - Primary    Continue with aggressive PT. Contact Dr. Beatrix Fettershris Adigun and Dr. Terrilee FilesZach Smith regarding their recommendations for anti-inflammatories that would not contribute to the fixed drug reaction. ?Turmeric? ?Cherry Juice? If those are not appropriate, or ineffective, we could 1. INCREASE duloxetine dose, or 2. Change gabapentin to the extended-release product and dry increasing the dose in hopes of reducing the pain fog he experiences on higher doses of the  immediate release product.      Relevant Medications   tiZANidine (ZANAFLEX) 4 MG tablet   HYDROcodone-acetaminophen (NORCO) 5-325 MG tablet      Return for re-evaluation as needed.Fernande Bras.   Jessi Pitstick S. Azuri Bozard, PA-C Primary Care at [facility]

## 2017-04-19 NOTE — Patient Instructions (Addendum)
Ask Dr. Kathyrn SheriffAdigun about naturopathic anti-inflammatories (cherry juice, turmeric). Also ask Dr. Terrilee FilesZach Smith. Consider increasing the Cymbalta dose or changing to the extended-release gabapentin (so that the dose could potentially be increased without causing the brain fog).  Increase the Zanaflex to a whole tablet at bedtime to see if that helps reduce the spasm and twitching.    IF you received an x-ray today, you will receive an invoice from [facility]. Please contact [facility] at [phone] with questions or concerns regarding your invoice.   IF you received labwork today, you will receive an invoice from [facility]. Please contact [facility] at [phone] with questions or concerns regarding your invoice.   Our billing staff will not be able to assist you with questions regarding bills from these companies.  You will be contacted with the lab results as soon as they are available. The fastest way to get your results is to activate your My Chart account. Instructions are located on the last page of this paperwork. If you have not heard from us regarding the results in 2 weeks, please contact this office.

## 2017-05-08 ENCOUNTER — Encounter: Payer: Self-pay | Admitting: Physician Assistant

## 2017-05-08 ENCOUNTER — Ambulatory Visit (INDEPENDENT_AMBULATORY_CARE_PROVIDER_SITE_OTHER): Payer: Self-pay | Admitting: Physician Assistant

## 2017-05-08 VITALS — BP 115/71 | HR 63 | Temp 98.6°F | Resp 17 | Ht 69.0 in | Wt 164.0 lb

## 2017-05-08 DIAGNOSIS — M6289 Other specified disorders of muscle: Secondary | ICD-10-CM

## 2017-05-08 DIAGNOSIS — M5116 Intervertebral disc disorders with radiculopathy, lumbar region: Secondary | ICD-10-CM

## 2017-05-08 MED ORDER — HYDROCODONE-ACETAMINOPHEN 5-325 MG PO TABS: 1.0000 | ORAL_TABLET | Freq: Four times a day (QID) | ORAL | 0 refills | Status: DC | PRN

## 2017-05-08 MED ORDER — GABAPENTIN 300 MG PO CAPS: ORAL_CAPSULE | ORAL | 3 refills | Status: DC

## 2017-05-08 NOTE — Progress Notes (Signed)
Patient ID: Craig Thomas, male    DOB: [birth year], 31 y.o.   MRN: [id number]  PCP: Porfirio Oar, PA-C  Chief Complaint  Patient presents with  . Medication Refill    vicodin     Subjective:   Presents for medication refill.  He continues to work with PT to address the pain and radicular symptoms associated with lumbar disc herniation.  The penile lesion was biopsied and diagnosed as a fixed drug reaction, so he has had to eliminate the NSAIDS used to treat the back pain. As a result, he has needed to increase his hydrocodone use. Cymbalta dose has also been increased from 30 mg to 60 mg daily. He continues tizanidine at HS and TID gabapentin. Despite this regimen, he still isn't sleeping well. The positions that reduce his pain are "not particularly comfortable for sleeping." He finds that he wakes up frequently during the night and has difficulty falling back to sleep.  Nocturia has resolved.  He has good and bad days, overall reports a "slow and steady improvement." Pelvic floor dysfunction exercises and treatments continue, most recently he's experienced the urge to defecate even when the rectum is empty.  Tingling in the LEFT leg is intermittent, associated with sitting.  He has follow-up visits with urology and dermatology next week.    Review of Systems No CP, SOB, HA, dizziness. No nausea, vomiting or diarrhea. No constipation. No urinary urgency, frequency, burning.    Patient Active Problem List   Diagnosis Date Noted  . Pelvic floor dysfunction 02/13/2017  . Lumbar disc herniation with radiculopathy 01/16/2017  . Balanitis 02/20/2013  . Asthma      Prior to Admission medications   Medication Sig Start Date End Date Taking? Authorizing Provider  albuterol (PROVENTIL HFA;VENTOLIN HFA) 108 (90 BASE) MCG/ACT inhaler Inhale 2 puffs into the lungs every 6 (six) hours as needed for wheezing.   Yes [provider]  beclomethasone (QVAR) 80 MCG/ACT  inhaler Inhale 2 puffs into the lungs 2 (two) times daily. Patient taking differently: Inhale 1 puff into the lungs daily.  02/20/13  Yes Paz, Nolon Rod, MD  cetirizine (ZYRTEC) 10 MG tablet Take 10 mg by mouth daily.   Yes [provider]  DULoxetine (CYMBALTA) 30 MG capsule Take 30 mg by mouth.    Yes [provider]  gabapentin (NEURONTIN) 400 MG capsule Take 400 mg by mouth 3 (three) times daily.   Yes [provider]  HYDROcodone-acetaminophen (NORCO) 5-325 MG tablet Take 1 tablet by mouth every 6 (six) hours as needed. 04/19/17  Yes Kabao Leite, PA-C  Multiple Vitamin (MULTIVITAMIN WITH MINERALS) TABS tablet Take 1 tablet by mouth daily.   Yes [provider]  tiZANidine (ZANAFLEX) 4 MG tablet TAKE 1/2-1 TABLET BY MOUTH AT BEDTIME AS NEEDED FOR SLEEP/RELAXATION 04/19/17  Yes Libbie Bartley, PA-C  Vitamin D, Ergocalciferol, (DRISDOL) 50000 units CAPS capsule Take 1 capsule (50,000 Units total) by mouth every 7 (seven) days. 03/13/17  Yes Judi Saa, DO     Allergies  Allergen Reactions  . Ibuprofen        Objective:  Physical Exam  Constitutional: He is oriented to person, place, and time. He appears well-developed and well-nourished. He is active and cooperative. No distress.  BP 115/71   Pulse 63   Temp 98.6 F (37 C) (Oral)   Resp 17   Ht 5\' 9"  (1.753 m)   Wt 164 lb (74.4 kg)   SpO2 98%  BMI 24.22 kg/m   HENT:  Head: Normocephalic and atraumatic.  Right Ear: Hearing, tympanic membrane, external ear and ear canal normal.  Left Ear: Hearing, tympanic membrane, external ear and ear canal normal.  Mouth/Throat: Uvula is midline, oropharynx is clear and moist and mucous membranes are normal. No oral lesions. Normal dentition.  Eyes: Conjunctivae are normal. No scleral icterus.  Neck: Normal range of motion. Neck supple. No thyromegaly present.  Cardiovascular: Normal rate, regular rhythm and normal heart sounds.   Pulses:      Radial  pulses are 2+ on the right side, and 2+ on the left side.  Pulmonary/Chest: Effort normal and breath sounds normal.  Lymphadenopathy:       Head (right side): No tonsillar, no preauricular, no posterior auricular and no occipital adenopathy present.       Head (left side): No tonsillar, no preauricular, no posterior auricular and no occipital adenopathy present.    He has no cervical adenopathy.       Right: No supraclavicular adenopathy present.       Left: No supraclavicular adenopathy present.  Neurological: He is alert and oriented to person, place, and time. No sensory deficit.  Skin: Skin is warm, dry and intact. No rash noted. No cyanosis or erythema. Nails show no clubbing.  Psychiatric: He has a normal mood and affect. His speech is normal and behavior is normal.           Assessment & Plan:   Problem List Items Addressed This Visit    Lumbar disc herniation with radiculopathy - Primary    Continue PT. Continue Cymbalta 60 mg daily. Continue tizanidine 4 mg at HS. Consider increasing dose to 8 mg. INCREASE evening dose of gabapentin from 300 mg to 600, then 900 mg. Continue prn hydrocodone.      Relevant Medications   gabapentin (NEURONTIN) 300 MG capsule   HYDROcodone-acetaminophen (NORCO) 5-325 MG tablet   Pelvic floor dysfunction    Continue PT.          Return if symptoms worsen or fail to improve.   Fernande Brashelle S. Edison Nicholson, PA-C Primary Care at [facility]

## 2017-05-08 NOTE — Assessment & Plan Note (Signed)
Continue PT. Continue Cymbalta 60 mg daily. Continue tizanidine 4 mg at HS. Consider increasing dose to 8 mg. INCREASE evening dose of gabapentin from 300 mg to 600, then 900 mg. Continue prn hydrocodone.

## 2017-05-08 NOTE — Assessment & Plan Note (Signed)
Continue PT

## 2017-05-08 NOTE — Patient Instructions (Signed)
     IF you received an x-ray today, you will receive an invoice from [facility]. Please contact [facility] at [phone] with questions or concerns regarding your invoice.   IF you received labwork today, you will receive an invoice from [facility]. Please contact [facility] at [phone] with questions or concerns regarding your invoice.   Our billing staff will not be able to assist you with questions regarding bills from these companies.  You will be contacted with the lab results as soon as they are available. The fastest way to get your results is to activate your My Chart account. Instructions are located on the last page of this paperwork. If you have not heard from us regarding the results in 2 weeks, please contact this office.     

## 2017-05-22 ENCOUNTER — Telehealth: Payer: Self-pay | Admitting: Family Medicine

## 2017-05-22 NOTE — Telephone Encounter (Signed)
I don't need to see this patient back until he needs a refill of his meds.

## 2017-05-22 NOTE — Telephone Encounter (Signed)
FYI. Does he need an OV?

## 2017-05-22 NOTE — Telephone Encounter (Signed)
CHELLE,  LE ANN MEGELL [facility] [phone] CALLED TO LET US KNOW THAT DR Kathyrn SheriffADIGUN  SAID THAT THE PT SKIN IS BACK TO NORMAL AND THAT HE IS TO VOID NSAID DRUGS FOR LIFE HE IS TO TAPER OFF PAIN MEDICINE UNDER YOUR OBSERVATION

## 2017-05-24 ENCOUNTER — Other Ambulatory Visit: Payer: Self-pay | Admitting: Physician Assistant

## 2017-05-24 DIAGNOSIS — M5116 Intervertebral disc disorders with radiculopathy, lumbar region: Secondary | ICD-10-CM

## 2017-05-26 NOTE — Telephone Encounter (Signed)
Pt req tiZanidine refill.

## 2017-05-30 ENCOUNTER — Ambulatory Visit: Payer: Self-pay | Admitting: Physician Assistant

## 2017-06-04 ENCOUNTER — Encounter: Payer: Self-pay | Admitting: Physician Assistant

## 2017-06-04 ENCOUNTER — Ambulatory Visit (INDEPENDENT_AMBULATORY_CARE_PROVIDER_SITE_OTHER): Payer: Self-pay | Admitting: Physician Assistant

## 2017-06-04 VITALS — BP 123/81 | HR 69 | Temp 98.6°F | Resp 18 | Ht 69.0 in | Wt 162.8 lb

## 2017-06-04 DIAGNOSIS — M5116 Intervertebral disc disorders with radiculopathy, lumbar region: Secondary | ICD-10-CM

## 2017-06-04 DIAGNOSIS — M6289 Other specified disorders of muscle: Secondary | ICD-10-CM

## 2017-06-04 DIAGNOSIS — E559 Vitamin D deficiency, unspecified: Secondary | ICD-10-CM | POA: Insufficient documentation

## 2017-06-04 DIAGNOSIS — J4520 Mild intermittent asthma, uncomplicated: Secondary | ICD-10-CM

## 2017-06-04 MED ORDER — ALBUTEROL SULFATE HFA 108 (90 BASE) MCG/ACT IN AERS: 2.0000 | INHALATION_SPRAY | Freq: Four times a day (QID) | RESPIRATORY_TRACT | 1 refills | Status: DC | PRN

## 2017-06-04 MED ORDER — HYDROCODONE-ACETAMINOPHEN 5-325 MG PO TABS: 1.0000 | ORAL_TABLET | Freq: Four times a day (QID) | ORAL | 0 refills | Status: DC | PRN

## 2017-06-04 NOTE — Patient Instructions (Addendum)
     IF you received an x-ray today, you will receive an invoice from [facility]. Please contact [facility] at [phone] with questions or concerns regarding your invoice.   IF you received labwork today, you will receive an invoice from [facility]. Please contact [facility] at [phone] with questions or concerns regarding your invoice.   Our billing staff will not be able to assist you with questions regarding bills from these companies.  You will be contacted with the lab results as soon as they are available. The fastest way to get your results is to activate your My Chart account. Instructions are located on the last page of this paperwork. If you have not heard from us regarding the results in 2 weeks, please contact this office.     

## 2017-06-04 NOTE — Progress Notes (Signed)
Patient ID: Craig Thomas, male    DOB: Mar 07, 1986, 31 y.o.   MRN: [id number]  PCP: Porfirio Oar, PA-C  Chief Complaint  Patient presents with  . Medication Refill    Norco 5-325 MG, Drisdol 40981, Proventil inhaler 108 MCG    Subjective:   Presents for medication refill.  His primary problem is pain, paresthesias, weakness associated with a lumbar disc herniation.  Rarely uses albuterol rescue inhaler. Has completed 12 weeks of Vitamin D 50,000 IU. PT is going really well. Initially had 30 % atrophy in the LEFT calf and 25% above the LEFT knee. Calf muscle has improved, but not the thigh. Paresthesias persist in the medial LEFT foot. Still not back to his previous running and hiking. Lying down is the most uncomfortable, with the most pain. He can do all his daily activities. Back at work. Hopes to become full time and get health benefits. Dating again!  Has reduced the gabapentin to 600 mg QHS, due to "brain fog."   Review of Systems As above. No CP, SOB, HA, dizziness. No urinary symptoms.    Patient Active Problem List   Diagnosis Date Noted  . Vitamin D deficiency 06/04/2017  . Pelvic floor dysfunction 02/13/2017  . Lumbar disc herniation with radiculopathy 01/16/2017  . Balanitis 02/20/2013  . Asthma      Prior to Admission medications   Medication Sig Start Date End Date Taking? Authorizing Provider  albuterol (PROVENTIL HFA;VENTOLIN HFA) 108 (90 BASE) MCG/ACT inhaler Inhale 2 puffs into the lungs every 6 (six) hours as needed for wheezing.   Yes [provider]  beclomethasone (QVAR) 80 MCG/ACT inhaler Inhale 2 puffs into the lungs 2 (two) times daily. Patient taking differently: Inhale 1 puff into the lungs daily.  02/20/13  Yes Paz, Nolon Rod, MD  cetirizine (ZYRTEC) 10 MG tablet Take 10 mg by mouth daily.   Yes [provider]  DULoxetine (CYMBALTA) 30 MG capsule Take 60 mg by mouth.    Yes [provider]  gabapentin  (NEURONTIN) 300 MG capsule Take one capsules QAM and midday, take 2-3 capsules at HS 05/08/17  Yes Athalia Setterlund, PA-C  HYDROcodone-acetaminophen (NORCO) 5-325 MG tablet Take 1 tablet by mouth every 6 (six) hours as needed. 05/08/17  Yes Ivelise Castillo, PA-C  Multiple Vitamin (MULTIVITAMIN WITH MINERALS) TABS tablet Take 1 tablet by mouth daily.   Yes [provider]  tiZANidine (ZANAFLEX) 4 MG tablet TAKE 1/2-1 TABLET BY MOUTH AT BEDTIME AS NEEDED FOR SLEEP/RELAXATION 05/26/17  Yes Javarion Douty, PA-C  Vitamin D, Ergocalciferol, (DRISDOL) 50000 units CAPS capsule Take 1 capsule (50,000 Units total) by mouth every 7 (seven) days. 03/13/17  Yes Judi Saa, DO     Allergies  Allergen Reactions  . Ibuprofen        Objective:  Physical Exam  Constitutional: He is oriented to person, place, and time. He appears well-developed and well-nourished. He is active and cooperative. No distress.  BP 123/81 (BP Location: Right Arm, Patient Position: Sitting, Cuff Size: Normal)   Pulse 69   Temp 98.6 F (37 C) (Oral)   Resp 18   Ht 5\' 9"  (1.753 m)   Wt 162 lb 12.8 oz (73.8 kg)   SpO2 100%   BMI 24.04 kg/m   HENT:  Head: Normocephalic and atraumatic.  Right Ear: Hearing normal.  Left Ear: Hearing normal.  Eyes: Conjunctivae are normal. No scleral icterus.  Neck: Normal range of motion. Neck supple. No thyromegaly  present.  Cardiovascular: Normal rate, regular rhythm and normal heart sounds.   Pulses:      Radial pulses are 2+ on the right side, and 2+ on the left side.  Pulmonary/Chest: Effort normal and breath sounds normal.  Lymphadenopathy:       Head (right side): No tonsillar, no preauricular, no posterior auricular and no occipital adenopathy present.       Head (left side): No tonsillar, no preauricular, no posterior auricular and no occipital adenopathy present.    He has no cervical adenopathy.       Right: No supraclavicular adenopathy present.       Left: No  supraclavicular adenopathy present.  Neurological: He is alert and oriented to person, place, and time. No sensory deficit.  Skin: Skin is warm, dry and intact. No rash noted. No cyanosis or erythema. Nails show no clubbing.  Psychiatric: He has a normal mood and affect. His speech is normal and behavior is normal.       Assessment & Plan:   Problem List Items Addressed This Visit    Asthma    Well-controlled. Rare use of rescue.      Relevant Medications   albuterol (PROVENTIL HFA;VENTOLIN HFA) 108 (90 Base) MCG/ACT inhaler   Lumbar disc herniation with radiculopathy - Primary    COntinue with PT, gabapentin and PRN hydrocodone.      Relevant Medications   HYDROcodone-acetaminophen (NORCO) 5-325 MG tablet   Pelvic floor dysfunction    Continue PT.      Vitamin D deficiency    Update level today. Adjust dose per results.      Relevant Orders   VITAMIN D 25 Hydroxy (Vit-D Deficiency, Fractures) (Completed)       Return in about 3 months (around 09/04/2017) for re-evaluation, sooner if needed.Fernande Bras.   Annelle Behrendt S. Estee Yohe, PA-C Primary Care at [facility]

## 2017-06-05 LAB — VITAMIN D 25 HYDROXY (VIT D DEFICIENCY, FRACTURES): Vit D, 25-Hydroxy: 48.2 ng/mL (ref 30.0–100.0)

## 2017-06-06 NOTE — Assessment & Plan Note (Signed)
COntinue with PT, gabapentin and PRN hydrocodone.

## 2017-06-06 NOTE — Assessment & Plan Note (Signed)
Well-controlled. Rare use of rescue.

## 2017-06-06 NOTE — Assessment & Plan Note (Signed)
Continue PT

## 2017-06-06 NOTE — Assessment & Plan Note (Signed)
Update level today. Adjust dose per results.

## 2017-06-26 ENCOUNTER — Other Ambulatory Visit: Payer: Self-pay | Admitting: Physician Assistant

## 2017-06-26 DIAGNOSIS — M5116 Intervertebral disc disorders with radiculopathy, lumbar region: Secondary | ICD-10-CM

## 2017-07-02 ENCOUNTER — Other Ambulatory Visit: Payer: Self-pay | Admitting: Physician Assistant

## 2017-07-02 DIAGNOSIS — M5116 Intervertebral disc disorders with radiculopathy, lumbar region: Secondary | ICD-10-CM

## 2017-07-05 MED ORDER — HYDROCODONE-ACETAMINOPHEN 5-325 MG PO TABS: 1.0000 | ORAL_TABLET | Freq: Four times a day (QID) | ORAL | 0 refills | Status: DC | PRN

## 2017-07-05 NOTE — Telephone Encounter (Signed)
Done

## 2017-07-06 ENCOUNTER — Encounter: Payer: Self-pay | Admitting: *Deleted

## 2017-07-06 NOTE — Telephone Encounter (Signed)
Sent mychart message to inform pt that Rx has been placed up front for pick up

## 2017-07-17 ENCOUNTER — Encounter: Payer: Self-pay | Admitting: Physician Assistant

## 2017-07-17 NOTE — Telephone Encounter (Signed)
Hey just wondering, I got a message that he scheduled with Katrinka Blazing on 8/31 through Mychart, but based on what he is saying I didn't know if maybe Katrinka Blazing thinks he needs to be seen sooner? I dont know the patients schedule and if maybe you guys wanted to work him in? I can call him if Katrinka Blazing thinks he needs to be seen sooner and I can work him in somewhere.  Thanks let me know

## 2017-07-22 ENCOUNTER — Ambulatory Visit (INDEPENDENT_AMBULATORY_CARE_PROVIDER_SITE_OTHER): Payer: Self-pay | Admitting: Physician Assistant

## 2017-07-22 ENCOUNTER — Encounter: Payer: Self-pay | Admitting: Physician Assistant

## 2017-07-22 ENCOUNTER — Ambulatory Visit (INDEPENDENT_AMBULATORY_CARE_PROVIDER_SITE_OTHER): Payer: Self-pay

## 2017-07-22 VITALS — BP 113/77 | HR 65 | Temp 98.6°F | Resp 18 | Ht 69.0 in | Wt 161.4 lb

## 2017-07-22 DIAGNOSIS — M5116 Intervertebral disc disorders with radiculopathy, lumbar region: Secondary | ICD-10-CM

## 2017-07-22 DIAGNOSIS — M549 Dorsalgia, unspecified: Secondary | ICD-10-CM

## 2017-07-22 MED ORDER — HYDROCODONE-ACETAMINOPHEN 5-325 MG PO TABS: 1.0000 | ORAL_TABLET | Freq: Four times a day (QID) | ORAL | 0 refills | Status: DC | PRN

## 2017-07-22 NOTE — Patient Instructions (Signed)
     IF you received an x-ray today, you will receive an invoice from [facility]. Please contact [facility] at [phone] with questions or concerns regarding your invoice.   IF you received labwork today, you will receive an invoice from [facility]. Please contact [facility] at [phone] with questions or concerns regarding your invoice.   Our billing staff will not be able to assist you with questions regarding bills from these companies.  You will be contacted with the lab results as soon as they are available. The fastest way to get your results is to activate your My Chart account. Instructions are located on the last page of this paperwork. If you have not heard from us regarding the results in 2 weeks, please contact this office.     

## 2017-07-22 NOTE — Progress Notes (Signed)
Patient ID: Craig Thomas, male    DOB: 1986/06/23, 31 y.o.   MRN: [id number]  PCP: Porfirio Oar, PA-C  Chief Complaint  Patient presents with  . Back Pain    x1 week, Pt states numbness and tingling down the legs and the groin. Pt states the same symptoms like back in January.    Subjective:   Presents for evaluation of recurrent back pain x 1 week.  He initially had pain like this following a run in January. At the time, he was being treated for a penile lesion and scrotal numbness and pain. MRI revealed mild disc degeneration of L4-5 and L5-S1 and small broad-based disc herniation at L4-5 with up to mild bilateral lateral recess stenosis but no spinal stenosis and a smaller disc herniation at L5-S1 with annular fissure, but no neural impingement. He saw neurosurgery, and was advised he did not have a surgical problem. The skin lesion turned out to be a fixed drug reaction to NSAIDS, and he has worked hard with PT to get control of the back pain.  He was back at work and doing well in general until 1 week ago. No specific trigger, just sudden onset of pain, getting progressively worse. Not quite as severe as the first episode last winter. The worst pain is in the back, he is unable to sit. Radicular pain and paresthesias have recurred, initially less severe than before, but has since worsened.. Pain in the scrotum is intermittent, and he now has numbness of the scrotum as well, which is new.  The pain was severe initially, but has essentially resolved with increasing the gabapentin back up to 1200 mg. Bruising in thighs has recurred. Not having the hip pain that he had before.  His stretching helps. Notes positive SLR in the AM before he stretches. Involuntary toe twitching on the LEFT.  Possible piriformis syndrome? He has scheduled with Dr. Terrilee Files for 07/26/2017.     Review of Systems As above.    Patient Active Problem List   Diagnosis Date Noted  .  Vitamin D deficiency 06/04/2017  . Pelvic floor dysfunction 02/13/2017  . Lumbar disc herniation with radiculopathy 01/16/2017  . Balanitis 02/20/2013  . Asthma      Prior to Admission medications   Medication Sig Start Date End Date Taking? Authorizing Provider  albuterol (PROVENTIL HFA;VENTOLIN HFA) 108 (90 Base) MCG/ACT inhaler Inhale 2 puffs into the lungs every 6 (six) hours as needed for wheezing. 06/04/17  Yes Yonis Carreon, PA-C  beclomethasone (QVAR) 80 MCG/ACT inhaler Inhale 2 puffs into the lungs 2 (two) times daily. Patient taking differently: Inhale 1 puff into the lungs daily.  02/20/13  Yes Paz, Nolon Rod, MD  cetirizine (ZYRTEC) 10 MG tablet Take 10 mg by mouth daily.   Yes [provider]  DULoxetine (CYMBALTA) 30 MG capsule Take 60 mg by mouth.    Yes [provider]  gabapentin (NEURONTIN) 300 MG capsule Take one capsules QAM and midday, take 2-3 capsules at HS Patient taking differently: Take 600 mg by mouth at bedtime. Take one capsules QAM and midday, take 2-3 capsules at HS 05/08/17  Yes Clarrisa Kaylor, PA-C  HYDROcodone-acetaminophen (NORCO) 5-325 MG tablet Take 1 tablet by mouth every 6 (six) hours as needed. 07/05/17  Yes Weber, Dema Severin, PA-C  Multiple Vitamin (MULTIVITAMIN WITH MINERALS) TABS tablet Take 1 tablet by mouth daily.   Yes [provider]  tiZANidine (ZANAFLEX) 4 MG tablet TAKE 1/2-1 TABLET BY  MOUTH AT BEDTIME AS NEEDED FOR SLEEP/RELAXATION 06/26/17  Yes Geoff Dacanay, PA-C  Vitamin D, Ergocalciferol, (DRISDOL) 50000 units CAPS capsule Take 1 capsule (50,000 Units total) by mouth every 7 (seven) days. 03/13/17  Yes Judi Saa, DO     Allergies  Allergen Reactions  . Ibuprofen        Objective:  Physical Exam  Constitutional: He is oriented to person, place, and time. He appears well-developed and well-nourished. He is active and cooperative. No distress.  BP 113/77 (BP Location: Right Arm, Patient Position: Sitting,  Cuff Size: Normal)   Pulse 65   Temp 98.6 F (37 C) (Oral)   Resp 18   Ht 5\' 9"  (1.753 m)   Wt 161 lb 6.4 oz (73.2 kg)   SpO2 99%   BMI 23.83 kg/m   HENT:  Head: Normocephalic and atraumatic.  Right Ear: Hearing normal.  Left Ear: Hearing normal.  Eyes: Conjunctivae are normal. No scleral icterus.  Neck: Normal range of motion. Neck supple. No thyromegaly present.  Cardiovascular: Normal rate, regular rhythm and normal heart sounds.   Pulses:      Radial pulses are 2+ on the right side, and 2+ on the left side.  Pulmonary/Chest: Effort normal and breath sounds normal.  Musculoskeletal:       Cervical back: Normal.       Thoracic back: Normal.       Lumbar back: He exhibits pain. He exhibits no bony tenderness. Tenderness: bilateral.  Lymphadenopathy:       Head (right side): No tonsillar, no preauricular, no posterior auricular and no occipital adenopathy present.       Head (left side): No tonsillar, no preauricular, no posterior auricular and no occipital adenopathy present.    He has no cervical adenopathy.       Right: No supraclavicular adenopathy present.       Left: No supraclavicular adenopathy present.  Neurological: He is alert and oriented to person, place, and time. He has normal strength. No sensory deficit.  Reflex Scores:      Patellar reflexes are 2+ on the right side and 2+ on the left side.      Achilles reflexes are 2+ on the right side and 2+ on the left side. Skin: Skin is warm, dry and intact. No rash noted. No cyanosis or erythema. Nails show no clubbing.  Psychiatric: He has a normal mood and affect. His speech is normal and behavior is normal.       Dg Lumbar Spine Complete  Result Date: 07/22/2017 CLINICAL DATA:  Recurrent low back pain with radiculopathy. EXAM: LUMBAR SPINE - COMPLETE 4+ VIEW COMPARISON:  MRI lumbar spine dated January 12, 2017. Lumbar spine x-rays dated January 09, 2017. FINDINGS: There is no evidence of lumbar spine fracture.  Straightening of the lumbar spine. Trace retrolisthesis of L4 on L5, unchanged. Vertebral body heights are preserved. Mild disc height loss at L4-L5 and L5-S1, similar to prior study. Mild left facet arthropathy at L5-S1. IMPRESSION: Mild degenerative disc disease at L4-L5 and L5-S1, similar to prior studies. Electronically Signed   By: Obie Dredge M.D.   On: 07/22/2017 10:50       Assessment & Plan:   Problem List Items Addressed This Visit    Lumbar disc herniation with radiculopathy   Relevant Medications   HYDROcodone-acetaminophen (NORCO) 5-325 MG tablet    Other Visit Diagnoses    Back pain without radiculopathy    -  Primary   Relevant  Medications   HYDROcodone-acetaminophen (NORCO) 5-325 MG tablet   Other Relevant Orders   DG Lumbar Spine Complete (Completed)     Radiographs are stable from previously. Proceed with visit with Dr. Katrinka Blazing later this week. Continue current treatment.  Return if symptoms worsen or fail to improve.   Fernande Bras, PA-C Primary Care at [facility]

## 2017-07-25 NOTE — Progress Notes (Signed)
[name] [address] [place], [place] [address] Phone: [phone] Subjective:     CC:  Low back pain with radiation  [id number]  Craig Thomas is a 31 y.o. male coming in with complaint of low back pain. Patient is having radiation down both legs. Patient has known bulging discs of the L5-S1 area previously. States that this feels about the same. Rates the severity pain is 8 out of 10. States that the last week and was even worse and seems to be improving. Seen in physical therapy and a regular basis.   MRI that was done 2 beers 17 2018 were independently visualized by me. Showed the patient does have small broad-based disc herniation L4-L5 with bilateral L5 nerve root involvement but no significant stenosis.  Past Medical History:  Diagnosis Date  . Allergic    pet and enviromental  . Asthma   . 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 TONSILLECTOMY     Social History   Social History  . Marital status: Married    Spouse name: N/A  . Number of children: 0  . Years of education: N/A   Occupational History  . Network engineer   Social History Main Topics  . Smoking status: Never Smoker  . Smokeless tobacco: Never Used  . Alcohol use Yes     Comment: socially   . Drug use: No  . Sexual activity: Yes   Other Topics Concern  . None   Social History Narrative  . None   Allergies  Allergen Reactions  . Ibuprofen    Family History  Problem Relation Age of Onset  . Adopted: Yes  . Heart disease Unknown        GF  . Heart disease Father      Past medical history, social, surgical and family history all reviewed in electronic medical record.  No pertanent information unless stated regarding to the chief complaint.   Review of Systems:Review of systems updated and as accurate as of 07/26/17  No headache, visual changes, nausea, vomiting,  diarrhea, constipation, dizziness, abdominal pain, skin rash, fevers, chills, night sweats, weight loss, swollen lymph nodes, body aches, joint swelling,  chest pain, shortness of breath, mood changes. Positive muscle aches  Objective  Blood pressure 112/80, pulse 69, height 5\' 9"  (1.753 m), weight 160 lb (72.6 kg), SpO2 98 %. Systems examined below as of 07/26/17   General: No apparent distress alert and oriented x3 mood and affect normal, dressed appropriately.  HEENT: Pupils equal, extraocular movements intact  Respiratory: Patient's speak in full sentences and does not appear short of breath  Cardiovascular: No lower extremity edema, non tender, no erythema  Skin: Warm dry intact with no signs of infection or rash on extremities or on axial skeleton.  Abdomen: Soft nontender  Neuro: Cranial nerves II through XII are intact, neurovascularly intact in all extremities with 2+ DTRs and 2+ pulses.  Lymph: No lymphadenopathy of posterior or anterior cervical chain or axillae bilaterally.  Gait normal with good balance and coordination.  MSK:  Non tender with full range of motion and good stability and symmetric strength and tone of shoulders, elbows, wrist, hip, knee and ankles bilaterally.  Back Exam:  Inspection: Unremarkable  Motion: Flexion 25 deg mild worsening symptoms, Extension 15 deg, Side Bending to 35 deg bilaterally,  Rotation to 35 deg bilaterally  SLR laying: Tightness bilaterally XSLR laying: Negative  Palpable tenderness: TTP in lumbar paraspinal musculature. Marland Kitchen. FABER: negative. Sensory change: Gross sensation intact to all lumbar and sacral dermatomes.  Reflexes: 2+ at both patellar tendons, 2+ at achilles tendons, Babinski's downgoing.  Strength at foot  Plantar-flexion: 5/5 Dorsi-flexion: 5/5 Eversion: 5/5 Inversion: 5/5  Leg strength  Quad: 5/5 Hamstring: 5/5 Hip flexor: 5/5 Hip abductors: 5/5  Gait unremarkable.  Osteopathic findings C2 flexed rotated and side bent  right C4 flexed rotated and side bent left C7 flexed rotated and side bent left T3 extended rotated and side bent right inhaled third rib T6 extended rotated and side bent left L2 flexed rotated and side bent right Sacrum right on right    Impression and Recommendations:     This case required medical decision making of moderate complexity.      Note: This dictation was prepared with Dragon dictation along with smaller phrase technology. Any transcriptional errors that result from this process are unintentional.

## 2017-07-26 ENCOUNTER — Encounter: Payer: Self-pay | Admitting: Family Medicine

## 2017-07-26 ENCOUNTER — Other Ambulatory Visit: Payer: Self-pay | Admitting: Physician Assistant

## 2017-07-26 ENCOUNTER — Ambulatory Visit (INDEPENDENT_AMBULATORY_CARE_PROVIDER_SITE_OTHER): Payer: Self-pay | Admitting: Family Medicine

## 2017-07-26 VITALS — BP 112/80 | HR 69 | Ht 69.0 in | Wt 160.0 lb

## 2017-07-26 DIAGNOSIS — M999 Biomechanical lesion, unspecified: Secondary | ICD-10-CM | POA: Insufficient documentation

## 2017-07-26 DIAGNOSIS — M5116 Intervertebral disc disorders with radiculopathy, lumbar region: Secondary | ICD-10-CM

## 2017-07-26 MED ORDER — PREDNISONE 50 MG PO TABS: 50.0000 mg | ORAL_TABLET | Freq: Every day | ORAL | 0 refills | Status: DC

## 2017-07-26 MED ORDER — KETOROLAC TROMETHAMINE 60 MG/2ML IM SOLN
60.0000 mg | Freq: Once | INTRAMUSCULAR | Status: AC
  Administered 2017-07-26: 60 mg via INTRAMUSCULAR

## 2017-07-26 NOTE — Assessment & Plan Note (Addendum)
Patient does have what appears to be a very small disc herniation. We discussed icing regimen and home exercises. Gabapentin. Prednisone if worsening pain. Attempted manipulation. Follow-up again in 2 weeks patient to be a candidate for epidural injections as well.

## 2017-07-26 NOTE — Assessment & Plan Note (Signed)
Decision today to treat with OMT was based on Physical Exam  After verbal consent patient was treated with HVLA, ME, FPR techniques in cervical, thoracic, lumbar and sacral areas  Patient tolerated the procedure well with improvement in symptoms  Patient given exercises, stretches and lifestyle modifications  See medications in patient instructions if given  Patient will follow up in 4 weeks 

## 2017-07-26 NOTE — Patient Instructions (Addendum)
Good to see you  [name] is your friend.  Injection today  Prednisone starting tomorrow if not better  Continue the other medicine If not better in Tuesday then mychart me and we will consider epidural  See me otherwise in 2 weeks.

## 2017-08-06 ENCOUNTER — Encounter: Payer: Self-pay | Admitting: Physician Assistant

## 2017-08-12 ENCOUNTER — Ambulatory Visit (INDEPENDENT_AMBULATORY_CARE_PROVIDER_SITE_OTHER): Payer: Self-pay | Admitting: Family Medicine

## 2017-08-12 ENCOUNTER — Other Ambulatory Visit: Payer: Self-pay | Admitting: Physician Assistant

## 2017-08-12 ENCOUNTER — Encounter: Payer: Self-pay | Admitting: Family Medicine

## 2017-08-12 VITALS — BP 110/70 | HR 62 | Ht 69.0 in | Wt 164.0 lb

## 2017-08-12 DIAGNOSIS — M5116 Intervertebral disc disorders with radiculopathy, lumbar region: Secondary | ICD-10-CM

## 2017-08-12 DIAGNOSIS — M999 Biomechanical lesion, unspecified: Secondary | ICD-10-CM

## 2017-08-12 NOTE — Assessment & Plan Note (Signed)
Still a concern. We'll continue to monitor. Patient still does not want to do any type of injections at this point. We'll continue with physical therapy. Does respond fairly well to manipulation. We'll keep on 4 week intervals until patient does continue to improve. Continue the gabapentin on the low doses Cymbalta. Follow-up 4 weeks

## 2017-08-12 NOTE — Progress Notes (Signed)
[name] [address] [place], [place] [address] Phone: [phone] Subjective:    CC:  Low back pain with radiation f/u  BJY:NWGNFAOZHY  Craig Thomas is a 31 y.o. male coming in with complaint of low back pain. Patient is having radiation down both legs. Patient has known bulging discs of the L5-S1 area previously. Patient was having worsening symptoms. Patient was given some injections as well as prednisone. Patient is doing better overall. Patient states making some progress. Patient states that still some tightness. Doing better though with the manipulation 9 as well as with the physical therapy. Does not want another epidural if possible. Would state overall 75% better than last exam    MRI that was done 2 beers 17 2018 were independently visualized by me. Showed the patient does have small broad-based disc herniation L4-L5 with bilateral L5 nerve root involvement but no significant stenosis.  Past Medical History:  Diagnosis Date  . Allergic    pet and enviromental  . Asthma   . 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 TONSILLECTOMY     Social History   Social History  . Marital status: Married    Spouse name: N/A  . Number of children: 0  . Years of education: N/A   Occupational History  . Network engineer   Social History Main Topics  . Smoking status: Never Smoker  . Smokeless tobacco: Never Used  . Alcohol use Yes     Comment: socially   . Drug use: No  . Sexual activity: Yes   Other Topics Concern  . None   Social History Narrative  . None   Allergies  Allergen Reactions  . Ibuprofen    Family History  Problem Relation Age of Onset  . Adopted: Yes  . Heart disease Unknown        GF  . Heart disease Father      Past medical history, social, surgical and family history all reviewed in electronic medical record.  No pertanent  information unless stated regarding to the chief complaint.   Review of Systems: No headache, visual changes, nausea, vomiting, diarrhea, constipation, dizziness, abdominal pain, skin rash, fevers, chills, night sweats, weight loss, swollen lymph nodes, body aches, joint swelling, chest pain, shortness of breath, mood changes.  Positive muscle aches  Objective  Blood pressure 110/70, pulse 62, height  (1.753 m), weight 164 lb (74.4 kg), SpO2 98 %. Systems examined below as of 08/12/17   General: No apparent distress alert and oriented x3 mood and affect normal, dressed appropriately.  HEENT: Pupils equal, extraocular movements intact  Respiratory: Patient's speak in full sentences and does not appear short of breath  Cardiovascular: No lower extremity edema, non tender, no erythema  Skin: Warm dry intact with no signs of infection or rash on extremities or on axial skeleton.  Abdomen: Soft nontender  Neuro: Cranial nerves II through XII are intact, neurovascularly intact in all extremities with 2+ DTRs and 2+ pulses.  Lymph: No lymphadenopathy of posterior or anterior cervical chain or axillae bilaterally.  Gait normal with good balance and coordination.  MSK:  Non tender with full range of motion and good stability and symmetric strength and tone of shoulders, elbows, wrist, hip, knee and ankles bilaterally.  Back Exam:  Inspection: Unremarkable  Motion: Flexion 25 deg, Extension 25 deg, Side Bending to  35 deg bilaterally,  Rotation to 45 deg bilaterally  SLR laying: Negative  XSLR laying: Negative  Palpable tenderness: None. FABER: negative. Sensory change: Gross sensation intact to all lumbar and sacral dermatomes.  Reflexes: 2+ at both patellar tendons, 2+ at achilles tendons, Babinski's downgoing.  Strength at foot  Plantar-flexion: 5/5 Dorsi-flexion: 5/5 Eversion: 5/5 Inversion: 5/5  Leg strength  Quad: 5/5 Hamstring: 5/5 Hip flexor: 5/5 Hip abductors: 5/5  Gait  unremarkable.    Osteopathic findings  C2 flexed rotated and side bent right C4 flexed rotated and side bent left T3 extended rotated and side bent right inhaled third rib T6 extended rotated and side bent left L2 flexed rotated and side bent right Sacrum right on right     Impression and Recommendations:     This case required medical decision making of moderate complexity.      Note: This dictation was prepared with Dragon dictation along with smaller phrase technology. Any transcriptional errors that result from this process are unintentional.

## 2017-08-12 NOTE — Patient Instructions (Addendum)
Good to see you  Craig Thomas is your friend.  Stay active.  Try to do the exercises regularly.  Consider trying dry needling one more time.  See me again in 4 weeks

## 2017-08-12 NOTE — Progress Notes (Signed)
  [name] [address] [place], [place] [address] Phone: [phone] Subjective:    I'm seeing this patient by the request  of:    CC:   BJY:NWGNFAOZHY  Craig Thomas is a 31 y.o. male coming in with complaint of back pain. Says his back is a little stiff. Explains that the steroids helped his pain. He still gets numbness in his legs. No sciatica pain. Last week he was walking and he felt pain in his right hip.   Onset-  Location Duration-  Character- Aggravating factors- Reliving factors-  Therapies tried-  Severity-     Past Medical History:  Diagnosis Date  . Allergic    pet and enviromental  . Asthma   . 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 TONSILLECTOMY     Social History   Social History  . Marital status: Married    Spouse name: N/A  . Number of children: 0  . Years of education: N/A   Occupational History  . Network engineer   Social History Main Topics  . Smoking status: Never Smoker  . Smokeless tobacco: Never Used  . Alcohol use Yes     Comment: socially   . Drug use: No  . Sexual activity: Yes   Other Topics Concern  . Not on file   Social History Narrative  . No narrative on file   Allergies  Allergen Reactions  . Ibuprofen    Family History  Problem Relation Age of Onset  . Adopted: Yes  . Heart disease Unknown        GF  . Heart disease Father      Past medical history, social, surgical and family history all reviewed in electronic medical record.  No pertanent information unless stated regarding to the chief complaint.   Review of Systems:Review of systems updated and as accurate as of 08/12/17  No headache, visual changes, nausea, vomiting, diarrhea, constipation, dizziness, abdominal pain, skin rash, fevers, chills, night sweats, weight loss, swollen lymph nodes, body aches, joint swelling, muscle aches,  chest pain, shortness of breath, mood changes.   Objective  There were no vitals taken for this visit. Systems examined below as of 08/12/17   General: No apparent distress alert and oriented x3 mood and affect normal, dressed appropriately.  HEENT: Pupils equal, extraocular movements intact  Respiratory: Patient's speak in full sentences and does not appear short of breath  Cardiovascular: No lower extremity edema, non tender, no erythema  Skin: Warm dry intact with no signs of infection or rash on extremities or on axial skeleton.  Abdomen: Soft nontender  Neuro: Cranial nerves II through XII are intact, neurovascularly intact in all extremities with 2+ DTRs and 2+ pulses.  Lymph: No lymphadenopathy of posterior or anterior cervical chain or axillae bilaterally.  Gait normal with good balance and coordination.  MSK:  Non tender with full range of motion and good stability and symmetric strength and tone of shoulders, elbows, wrist, hip, knee and ankles bilaterally.     Impression and Recommendations:     This case required medical decision making of moderate complexity.      Note: This dictation was prepared with Dragon dictation along with smaller phrase technology. Any transcriptional errors that result from this process are unintentional.

## 2017-08-12 NOTE — Assessment & Plan Note (Signed)
Decision today to treat with OMT was based on Physical Exam  After verbal consent patient was treated with HVLA, ME, FPR techniques in  thoracic, lumbar and sacral areas  Patient tolerated the procedure well with improvement in symptoms  Patient given exercises, stretches and lifestyle modifications  See medications in patient instructions if given  Patient will follow up in 4 weeks 

## 2017-08-14 MED ORDER — HYDROCODONE-ACETAMINOPHEN 5-325 MG PO TABS: 1.0000 | ORAL_TABLET | Freq: Four times a day (QID) | ORAL | 0 refills | Status: DC | PRN

## 2017-08-14 NOTE — Telephone Encounter (Signed)
Patient notified via My Chart.  Meds ordered this encounter  Medications  . HYDROcodone-acetaminophen (NORCO) 5-325 MG tablet    Sig: Take 1 tablet by mouth every 6 (six) hours as needed.    Dispense:  30 tablet    Refill:  0

## 2017-09-03 ENCOUNTER — Ambulatory Visit (INDEPENDENT_AMBULATORY_CARE_PROVIDER_SITE_OTHER): Payer: Self-pay | Admitting: Physician Assistant

## 2017-09-03 ENCOUNTER — Encounter: Payer: Self-pay | Admitting: Physician Assistant

## 2017-09-03 VITALS — BP 110/70 | HR 84 | Temp 99.1°F | Resp 18 | Ht 69.0 in | Wt 162.8 lb

## 2017-09-03 DIAGNOSIS — J4520 Mild intermittent asthma, uncomplicated: Secondary | ICD-10-CM

## 2017-09-03 DIAGNOSIS — M5116 Intervertebral disc disorders with radiculopathy, lumbar region: Secondary | ICD-10-CM

## 2017-09-03 MED ORDER — BECLOMETHASONE DIPROPIONATE 80 MCG/ACT IN AERS: 1.0000 | INHALATION_SPRAY | Freq: Every day | RESPIRATORY_TRACT | 5 refills | Status: DC

## 2017-09-03 NOTE — Patient Instructions (Addendum)
Keep up the great work! I think that the steroid injection is really reasonable at this point.    IF you received an x-ray today, you will receive an invoice from [facility]. Please contact [facility] at [phone] with questions or concerns regarding your invoice.   IF you received labwork today, you will receive an invoice from [facility]. Please contact [facility] at [phone] with questions or concerns regarding your invoice.   Our billing staff will not be able to assist you with questions regarding bills from these companies.  You will be contacted with the lab results as soon as they are available. The fastest way to get your results is to activate your My Chart account. Instructions are located on the last page of this paperwork. If you have not heard from [place] regarding the results in 2 weeks, please contact this office.

## 2017-09-03 NOTE — Progress Notes (Signed)
Subjective:    Patient ID: Craig Thomas, male    DOB: 12-Jul-1986, 31 y.o.   MRN: [id number]  HPI Chief Complaint  Patient presents with  . Consult on Meds    Pt states he would like to discuss new treatment options.  . Back Pain  . Follow-up    3 month check up  . Medication Refill    QVAR 80 MCG    Patient returns today for low back pain and radiculopathy in lower extremities bilaterally. Overall, he states his pain is improved. But he continues to have numbness of the lower extremities in the L4-L5 and L5-S1 and saddle distribution, worse on the LEFT. He notes that it is impacting his sex life as he has decreased sensation. He reports significant tightness of both legs. When he wakes up in the morning, his left leg is contracted and he will have to stretch it out. Denies any weakness in the lower extremities. He denies any recent bladder or bowel dysfunction. He is continuing with PT and does 120 minutes of stretching a day. Patient is considering a epidural steroid injection.   Review of Systems As above.  Patient Active Problem List   Diagnosis Date Noted  . Nonallopathic lesion of lumbosacral region 07/26/2017  . Nonallopathic lesion of sacral region 07/26/2017  . Nonallopathic lesion of thoracic region 07/26/2017  . Vitamin D deficiency 06/04/2017  . Pelvic floor dysfunction 02/13/2017  . Lumbar disc herniation with radiculopathy 01/16/2017  . Balanitis 02/20/2013  . Asthma    Prior to Admission medications   Medication Sig Start Date End Date Taking? Authorizing Provider  albuterol (PROVENTIL HFA;VENTOLIN HFA) 108 (90 Base) MCG/ACT inhaler Inhale 2 puffs into the lungs every 6 (six) hours as needed for wheezing. 06/04/17  Yes Jeffery, Chelle, PA-C  beclomethasone (QVAR) 80 MCG/ACT inhaler Inhale 2 puffs into the lungs 2 (two) times daily. Patient taking differently: Inhale 1 puff into the lungs daily.  02/20/13  Yes Paz, Nolon Rod, MD  cetirizine (ZYRTEC) 10 MG tablet Take  10 mg by mouth daily.   Yes [provider]  DULoxetine (CYMBALTA) 30 MG capsule Take 60 mg by mouth.    Yes [provider]  gabapentin (NEURONTIN) 300 MG capsule Take one capsules QAM and midday, take 2-3 capsules at HS Patient taking differently: Take 600 mg by mouth at bedtime. Take one capsules QAM and midday, take 2-3 capsules at HS 05/08/17  Yes Jeffery, Chelle, PA-C  HYDROcodone-acetaminophen (NORCO) 5-325 MG tablet Take 1 tablet by mouth every 6 (six) hours as needed. 08/14/17  Yes Jeffery, Chelle, PA-C  Multiple Vitamin (MULTIVITAMIN WITH MINERALS) TABS tablet Take 1 tablet by mouth daily.   Yes [provider]  tiZANidine (ZANAFLEX) 4 MG tablet TAKE 1/2-1 TABLET BY MOUTH AT BEDTIME AS NEEDED FOR SLEEP/RELAXATION 07/26/17  Yes Jeffery, Chelle, PA-C  predniSONE (DELTASONE) 50 MG tablet Take 1 tablet (50 mg total) by mouth daily. Patient not taking: Reported on 08/12/2017 07/26/17   Judi Saa, DO   Allergies  Allergen Reactions  . Ibuprofen    Social History   Social History  . Marital status: Married    Spouse name: N/A  . Number of children: 0  . Years of education: N/A   Occupational History  . Network engineer   Social History Main Topics  . Smoking status: Never Smoker  . Smokeless tobacco: Never Used  . Alcohol use Yes     Comment: socially   . Drug  use: No  . Sexual activity: Yes   Other Topics Concern  . Not on file   Social History Narrative  . No narrative on file      Objective:   Physical Exam  Constitutional: He is oriented to person, place, and time. He appears well-developed and well-nourished. No distress.  Cardiovascular: Normal rate, regular rhythm, normal heart sounds and intact distal pulses.  Exam reveals no gallop and no friction rub.   No murmur heard. Pulmonary/Chest: Effort normal and breath sounds normal. No respiratory distress. He has no wheezes. He has no rales. He exhibits no tenderness.  Neurological:  He is alert and oriented to person, place, and time. He has normal reflexes. He displays no atrophy. He exhibits normal muscle tone.  Negative clonus bilaterally. Weakness of EHL on the RIGHT.  Skin: Skin is warm and dry. No rash noted. He is not diaphoretic. No erythema. No pallor.  Psychiatric: He has a normal mood and affect. His behavior is normal. Judgment and thought content normal.      Assessment & Plan:  1. Lumbar disc herniation with radiculopathy - Patient considering an epidural steroid injection. Continue with stretching and PT.   2. Mild intermittent asthma without complication - beclomethasone (QVAR) 80 MCG/ACT inhaler; Inhale 1 puff into the lungs daily.  Dispense: 8.7 g; Refill: 5  Follow up as needed  Respectfully, Gala Romney PA-S 2019

## 2017-09-03 NOTE — Progress Notes (Signed)
Patient ID: Craig Thomas, male    DOB: 1986-04-21, 31 y.o.   MRN: [id number]  PCP: Porfirio Oar, PA-C  Chief Complaint  Patient presents with  . Consult on Meds    Pt states he would like to discuss new treatment options.  . Back Pain  . Follow-up    3 month check up  . Medication Refill    QVAR 80 MCG    Subjective:   Presents for medication refills and re-evaluation of back pain.  Overall, he's doing really well. He is continuing with PT and Sports Medicine. He's tolerating the medications and does 40 minutes of stretching TID. He continues to experience numbness of the genitalia and upper thighs, which has a significant negative impact on his relationship.  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  Review of Systems As above.    Patient Active Problem List   Diagnosis Date Noted  . Nonallopathic lesion of lumbosacral region 07/26/2017  . Nonallopathic lesion of sacral region 07/26/2017  . Nonallopathic lesion of thoracic region 07/26/2017  . Vitamin D deficiency 06/04/2017  . Pelvic floor dysfunction 02/13/2017  . Lumbar disc herniation with radiculopathy 01/16/2017  . Balanitis 02/20/2013  . Asthma      Prior to Admission medications   Medication Sig Start Date End Date Taking? Authorizing Provider  albuterol (PROVENTIL HFA;VENTOLIN HFA) 108 (90 Base) MCG/ACT inhaler Inhale 2 puffs into the lungs every 6 (six) hours as needed for wheezing. 06/04/17  Yes Yovanna Cogan, PA-C  beclomethasone (QVAR) 80 MCG/ACT inhaler Inhale 2 puffs into the lungs 2 (two) times daily. Patient taking differently: Inhale 1 puff into the lungs daily.  02/20/13  Yes Paz, Nolon Rod, MD  cetirizine (ZYRTEC) 10 MG tablet Take 10 mg by mouth daily.   Yes [provider]  DULoxetine (CYMBALTA) 30 MG capsule Take 60 mg by mouth.    Yes [provider]  gabapentin (NEURONTIN)  300 MG capsule Take one capsules QAM and midday, take 2-3 capsules at HS Patient taking differently: Take 600 mg by mouth at bedtime. Take one capsules QAM and midday, take 2-3 capsules at HS 05/08/17  Yes Ceaser Ebeling, PA-C  HYDROcodone-acetaminophen (NORCO) 5-325 MG tablet Take 1 tablet by mouth every 6 (six) hours as needed. 08/14/17  Yes Kavita Bartl, PA-C  Multiple Vitamin (MULTIVITAMIN WITH MINERALS) TABS tablet Take 1 tablet by mouth daily.   Yes [provider]  tiZANidine (ZANAFLEX) 4 MG tablet TAKE 1/2-1 TABLET BY MOUTH AT BEDTIME AS NEEDED FOR SLEEP/RELAXATION 07/26/17  Yes Duff Pozzi, PA-C  predniSONE (DELTASONE) 50 MG tablet Take 1 tablet (50 mg total) by mouth daily. Patient not taking: Reported on 08/12/2017 07/26/17   Judi Saa, DO     Allergies  Allergen Reactions  . Ibuprofen        Objective:  Physical Exam  Constitutional: He is oriented to person, place, and time. He appears well-developed and well-nourished. He is active and cooperative. No distress.  BP 110/70 (BP Location: Right Arm, Patient Position: Sitting, Cuff Size: Normal)   Pulse 84   Temp 99.1 F (37.3 C) (Oral)   Resp 18   Ht  (1.753 m)   Wt 162 lb 12.8 oz (73.8 kg)   SpO2 98%   BMI 24.04 kg/m   HENT:  Head: Normocephalic and atraumatic.  Right Ear: Hearing normal.  Left Ear: Hearing  normal.  Eyes: Conjunctivae are normal. No scleral icterus.  Neck: Normal range of motion. Neck supple. No thyromegaly present.  Cardiovascular: Normal rate, regular rhythm and normal heart sounds.   Pulses:      Radial pulses are 2+ on the right side, and 2+ on the left side.  Pulmonary/Chest: Effort normal and breath sounds normal.  Lymphadenopathy:       Head (right side): No tonsillar, no preauricular, no posterior auricular and no occipital adenopathy present.       Head (left side): No tonsillar, no preauricular, no posterior auricular and no occipital adenopathy present.    He  has no cervical adenopathy.       Right: No supraclavicular adenopathy present.       Left: No supraclavicular adenopathy present.  Neurological: He is alert and oriented to person, place, and time. No sensory deficit.  Skin: Skin is warm, dry and intact. No rash noted. No cyanosis or erythema. Nails show no clubbing.  Psychiatric: He has a normal mood and affect. His speech is normal and behavior is normal.           Assessment & Plan:   Problem List Items Addressed This Visit    Asthma    Controlled. Stable. Continue current treatment.      Relevant Medications   beclomethasone (QVAR) 80 MCG/ACT inhaler   Lumbar disc herniation with radiculopathy - Primary    Continue with his current treatment. Considering steroid injections, certainly appropriate at this point.          Return if symptoms worsen or fail to improve.   Fernande Bras, PA-C [facility]

## 2017-09-05 NOTE — Assessment & Plan Note (Signed)
Continue with his current treatment. Considering steroid injections, certainly appropriate at this point.

## 2017-09-05 NOTE — Assessment & Plan Note (Signed)
Controlled. Stable. Continue current treatment. 

## 2017-09-09 ENCOUNTER — Ambulatory Visit (INDEPENDENT_AMBULATORY_CARE_PROVIDER_SITE_OTHER): Payer: Self-pay | Admitting: Family Medicine

## 2017-09-09 ENCOUNTER — Other Ambulatory Visit: Payer: Self-pay | Admitting: Physician Assistant

## 2017-09-09 ENCOUNTER — Encounter: Payer: Self-pay | Admitting: Family Medicine

## 2017-09-09 VITALS — BP 118/70 | HR 84 | Ht 69.0 in | Wt 152.0 lb

## 2017-09-09 DIAGNOSIS — G8929 Other chronic pain: Secondary | ICD-10-CM

## 2017-09-09 DIAGNOSIS — M549 Dorsalgia, unspecified: Secondary | ICD-10-CM

## 2017-09-09 DIAGNOSIS — M5116 Intervertebral disc disorders with radiculopathy, lumbar region: Secondary | ICD-10-CM

## 2017-09-09 DIAGNOSIS — M999 Biomechanical lesion, unspecified: Secondary | ICD-10-CM

## 2017-09-09 NOTE — Assessment & Plan Note (Signed)
Have an L5 nerve root impingement bilaterally. I do feel an epidural could be beneficial at L4-L5. We discussed icing regimen, home exercise, which activities to do.

## 2017-09-09 NOTE — Assessment & Plan Note (Signed)
Decision today to treat with OMT was based on Physical Exam  After verbal consent patient was treated with HVLA, ME, FPR techniques in cervical, thoracic, lumbar and sacral areas  Patient tolerated the procedure well with improvement in symptoms  Patient given exercises, stretches and lifestyle modifications  See medications in patient instructions if given  Patient will follow up in 4-6 weeks 

## 2017-09-09 NOTE — Progress Notes (Signed)
[facility] [address] [place], [place] [address] Phone: [phone] Subjective:    CC:  Low back pain with radiation f/u  [id number]  Craig Thomas is a 31 y.o. male coming in with complaint of low back pain. Patient is having radiation down both legs. Patient has known bulging discs of the L5-S1 area previously. Patient was having worsening symptoms. Patient unfortunately feels like he has plateaued at this time. Having significant discomfort again. Patient has noticed more radiation down the legs as well. Patient since then is affecting daily activities. Feels that the pain medications is also having significant side effects and many other parts of his life. Running and was can be done.   MRI that was done 2 beers 17 2018 were independently visualized by me. Showed the patient does have small broad-based disc herniation L4-L5 with bilateral L5 nerve root involvement but no significant stenosis.  Past Medical History:  Diagnosis Date  . Allergic    pet and enviromental  . Asthma   . 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 TONSILLECTOMY     Social History   Social History  . Marital status: Married    Spouse name: N/A  . Number of children: 0  . Years of education: N/A   Occupational History  . Network engineer   Social History Main Topics  . Smoking status: Never Smoker  . Smokeless tobacco: Never Used  . Alcohol use Yes     Comment: socially   . Drug use: No  . Sexual activity: Yes   Other Topics Concern  . None   Social History Narrative  . None   Allergies  Allergen Reactions  . Ibuprofen    Family History  Problem Relation Age of Onset  . Adopted: Yes  . Heart disease Unknown        GF  . Heart disease Father      Past medical history, social, surgical and family history all reviewed in electronic medical record.  No pertanent  information unless stated regarding to the chief complaint.   Review of Systems: No headache, visual changes, nausea, vomiting, diarrhea, constipation, dizziness, abdominal pain, skin rash, fevers, chills, night sweats, weight loss, swollen lymph nodes, body aches, joint swelling, chest pain, shortness of breath, mood changes.  Positive muscle aches  Objective  Blood pressure 118/70, pulse 84, height  (1.753 m), weight 152 lb (68.9 kg), SpO2 97 %.   Systems examined below as of 09/09/17 General: NAD A&O x3 mood, affect normal  HEENT: Pupils equal, extraocular movements intact no nystagmus Respiratory: not short of breath at rest or with speaking Cardiovascular: No lower extremity edema, non tender Skin: Warm dry intact with no signs of infection or rash on extremities or on axial skeleton. Abdomen: Soft nontender, no masses Neuro: Cranial nerves  intact, neurovascularly intact in all extremities with 2+ DTRs and 2+ pulses. Lymph: No lymphadenopathy appreciated today  Gait normal with good balance and coordination.  MSK: Non tender with full range of motion and good stability and symmetric strength and tone of shoulders, elbows, wrist,  knee hips and ankles bilaterally.   Back Exam:  Inspection: Unremarkable  Motion: Flexion 45 deg, Extension 25 deg, Side Bending to 35 deg bilaterally,  Rotation to 45 deg bilaterally  SLR laying: Significant tenderness bilaterally mild radiation down the L5 XSLR laying: Negative  Palpable tenderness: Tender to palpation of the paraspinal musculature lumbar spine. FABER: negative. Sensory change: Gross sensation intact to all lumbar and sacral dermatomes.  Reflexes: 2+ at both patellar tendons, 2+ at achilles tendons, Babinski's downgoing.  Strength at foot  Plantar-flexion: 5/5 Dorsi-flexion: 5/5 Eversion: 5/5 Inversion: 5/5  Leg strength  Quad: 5/5 Hamstring: 5/5 Hip flexor: 5/5 Hip abductors: 4/5 but symmetric Gait  unremarkable.    Osteopathic findings C2 flexed rotated and side bent right C4 flexed rotated and side bent left C7 flexed rotated and side bent left T3 extended rotated and side bent right inhaled third rib T7 extended rotated and side bent left L3 flexed rotated and side bent right Sacrum right on right      Impression and Recommendations:     This case required medical decision making of moderate complexity.      Note: This dictation was prepared with Dragon dictation along with smaller phrase technology. Any transcriptional errors that result from this process are unintentional.

## 2017-09-09 NOTE — Patient Instructions (Signed)
Good to see you  Craig Thomas is your friend.  We will get an epidural  Continue everything else but hopefully we can decrease some of the meds See me again 2 weeks AFTER the epidural

## 2017-09-11 MED ORDER — HYDROCODONE-ACETAMINOPHEN 5-325 MG PO TABS: 1.0000 | ORAL_TABLET | Freq: Four times a day (QID) | ORAL | 0 refills | Status: DC | PRN

## 2017-09-11 NOTE — Telephone Encounter (Signed)
Patient notified via My Chart.  Meds ordered this encounter  Medications  . HYDROcodone-acetaminophen (NORCO) 5-325 MG tablet    Sig: Take 1 tablet by mouth every 6 (six) hours as needed.    Dispense:  30 tablet    Refill:  0

## 2017-09-24 ENCOUNTER — Other Ambulatory Visit: Payer: Self-pay | Admitting: Physician Assistant

## 2017-09-24 DIAGNOSIS — M5116 Intervertebral disc disorders with radiculopathy, lumbar region: Secondary | ICD-10-CM

## 2017-09-25 MED ORDER — TIZANIDINE HCL 4 MG PO TABS: 2.0000 mg | ORAL_TABLET | Freq: Every evening | ORAL | 5 refills | Status: DC | PRN

## 2017-09-25 NOTE — Telephone Encounter (Signed)
Meds ordered this encounter  Medications  . tiZANidine (ZANAFLEX) 4 MG tablet    Sig: Take 0.5-1 tablets (2-4 mg total) by mouth at bedtime as needed for muscle spasms.    Dispense:  30 tablet    Refill:  5

## 2017-12-10 ENCOUNTER — Ambulatory Visit (INDEPENDENT_AMBULATORY_CARE_PROVIDER_SITE_OTHER): Payer: Self-pay | Admitting: Physician Assistant

## 2017-12-10 ENCOUNTER — Encounter: Payer: Self-pay | Admitting: Physician Assistant

## 2017-12-10 VITALS — BP 102/80 | HR 85 | Temp 99.5°F | Resp 18 | Ht 69.0 in | Wt 162.4 lb

## 2017-12-10 DIAGNOSIS — M5116 Intervertebral disc disorders with radiculopathy, lumbar region: Secondary | ICD-10-CM

## 2017-12-10 DIAGNOSIS — J4520 Mild intermittent asthma, uncomplicated: Secondary | ICD-10-CM

## 2017-12-10 DIAGNOSIS — M6289 Other specified disorders of muscle: Secondary | ICD-10-CM

## 2017-12-10 DIAGNOSIS — M999 Biomechanical lesion, unspecified: Secondary | ICD-10-CM

## 2017-12-10 MED ORDER — BECLOMETHASONE DIPROPIONATE 80 MCG/ACT IN AERS: 1.0000 | INHALATION_SPRAY | Freq: Every day | RESPIRATORY_TRACT | 5 refills | Status: DC

## 2017-12-10 MED ORDER — ALBUTEROL SULFATE HFA 108 (90 BASE) MCG/ACT IN AERS: 2.0000 | INHALATION_SPRAY | Freq: Four times a day (QID) | RESPIRATORY_TRACT | 1 refills | Status: DC | PRN

## 2017-12-10 NOTE — Patient Instructions (Signed)
     IF you received an x-ray today, you will receive an invoice from [facility]. Please contact [facility] at [phone] with questions or concerns regarding your invoice.   IF you received labwork today, you will receive an invoice from [facility]. Please contact [facility] at [phone] with questions or concerns regarding your invoice.   Our billing staff will not be able to assist you with questions regarding bills from these companies.  You will be contacted with the lab results as soon as they are available. The fastest way to get your results is to activate your My Chart account. Instructions are located on the last page of this paperwork. If you have not heard from us regarding the results in 2 weeks, please contact this office.     

## 2017-12-10 NOTE — Progress Notes (Signed)
Patient ID: Craig MannsMichael Thomas, male    DOB: [birth year], 32 y.o.   MRN: [id number]  PCP: Porfirio OarJeffery, Reiley Bertagnolli, PA-C  Chief Complaint  Patient presents with  . Back Pain  . Follow-up    Subjective:   Presents for evaluation of low back pain.  Continues with PT 1-2x/week working on balance, posture, core strengthening and body mechanics to address the LBP and pain and numbness that radiates into the groin and leg due to herniated disc.  Sensation has improved from 40% at its worst to 10% of normal. He continues to consider the option of steroid injections.  No loss of bowel or bladder control. No falls.  Asthma is well controlled, he needs refills of steroid maintenance inhaler and albuterol rescue inhaler.    Review of Systems  Constitutional: Negative for chills and fever.  HENT: Positive for congestion. Negative for sore throat.   Eyes: Negative for visual disturbance.  Respiratory: Negative for cough and shortness of breath.   Cardiovascular: Negative for chest pain, palpitations and leg swelling.  Gastrointestinal: Negative for diarrhea, nausea and vomiting.  Endocrine: Negative for polydipsia.  Genitourinary: Negative for dysuria, frequency and urgency.  Musculoskeletal: Positive for back pain. Negative for arthralgias, gait problem, joint swelling and myalgias.  Skin: Negative for rash.  Neurological: Positive for numbness. Negative for dizziness, weakness and headaches.       Patient Active Problem List   Diagnosis Date Noted  . Nonallopathic lesion of lumbosacral region 07/26/2017  . Nonallopathic lesion of sacral region 07/26/2017  . Nonallopathic lesion of thoracic region 07/26/2017  . Vitamin D deficiency 06/04/2017  . Pelvic floor dysfunction 02/13/2017  . Lumbar disc herniation with radiculopathy 01/16/2017  . Balanitis 02/20/2013  . Asthma      Prior to Admission medications   Medication Sig Start Date End Date Taking? Authorizing Provider    albuterol (PROVENTIL HFA;VENTOLIN HFA) 108 (90 Base) MCG/ACT inhaler Inhale 2 puffs into the lungs every 6 (six) hours as needed for wheezing. 06/04/17  Yes Finlay Mills, PA-C  beclomethasone (QVAR) 80 MCG/ACT inhaler Inhale 1 puff into the lungs daily. 09/03/17  Yes Ector Laurel, PA-C  cetirizine (ZYRTEC) 10 MG tablet Take 10 mg by mouth daily.   Yes [provider]  gabapentin (NEURONTIN) 300 MG capsule Take one capsules QAM and midday, take 2-3 capsules at HS Patient taking differently: Take 600 mg by mouth at bedtime. Take one capsules QAM and midday, take 2-3 capsules at HS 05/08/17  Yes Amaury Kuzel, PA-C  Multiple Vitamin (MULTIVITAMIN WITH MINERALS) TABS tablet Take 1 tablet by mouth daily.   Yes [provider]     Allergies  Allergen Reactions  . Ibuprofen        Objective:  Physical Exam  Constitutional: He is oriented to person, place, and time. He appears well-developed and well-nourished. He is active and cooperative. No distress.  BP 102/80 (BP Location: Left Arm, Patient Position: Sitting, Cuff Size: Normal)   Pulse 85   Temp 99.5 F (37.5 C) (Oral)   Resp 18   Ht 5\' 9"  (1.753 m)   Wt 162 lb 6.4 oz (73.7 kg)   SpO2 99%   BMI 23.98 kg/m   HENT:  Head: Normocephalic and atraumatic.  Right Ear: Hearing normal.  Left Ear: Hearing normal.  Eyes: Conjunctivae are normal. No scleral icterus.  Neck: Normal range of motion. Neck supple. No thyromegaly present.  Cardiovascular: Normal rate, regular rhythm and normal heart sounds.  Pulses:  Radial pulses are 2+ on the right side, and 2+ on the left side.  Pulmonary/Chest: Effort normal and breath sounds normal.  Lymphadenopathy:       Head (right side): No tonsillar, no preauricular, no posterior auricular and no occipital adenopathy present.       Head (left side): No tonsillar, no preauricular, no posterior auricular and no occipital adenopathy present.    He has no cervical adenopathy.        Right: No supraclavicular adenopathy present.       Left: No supraclavicular adenopathy present.  Neurological: He is alert and oriented to person, place, and time. No sensory deficit.  Skin: Skin is warm, dry and intact. No rash noted. No cyanosis or erythema. Nails show no clubbing.  Psychiatric: He has a normal mood and affect. His speech is normal and behavior is normal.           Assessment & Plan:   Problem List Items Addressed This Visit    Asthma   Relevant Medications   beclomethasone (QVAR) 80 MCG/ACT inhaler   albuterol (PROVENTIL HFA;VENTOLIN HFA) 108 (90 Base) MCG/ACT inhaler   Lumbar disc herniation with radiculopathy - Primary   Pelvic floor dysfunction   Nonallopathic lesion of lumbosacral region   Nonallopathic lesion of sacral region   Nonallopathic lesion of thoracic region     Stable issues. Refill medications for asthma. Continue PT and contemplation of steroid injections.  Return in about 6 months (around 06/09/2018) for re-evaluation of asthma and back pain, sooner if needed.   Fernande Bras, PA-C [facility] at [facility]

## 2017-12-10 NOTE — Progress Notes (Signed)
Subjective:    Patient ID: Craig [facility], male    DOB: 1986-07-28, 32 y.o.   MRN: [id number]  Chief Complaint  Patient presents with  . Back Pain  . Follow-up    Patient has been doing to PT 1-2x/week for his lumbar disc herniation with radiculopathy. PT is working on his imbalance, posture, and the numbness he experiences from his groin down his leg. Patient has gone from "60% lack of sensation to 10% and only when I'm poking myself." PT treatment is going really well and he is happy with the progress that has been made. He is still considering Epidural injections, but has not scheduled the appointment yet. He denies any saddle anesthesia or bladder/bowel dysfunction.   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 "Now it is just scar tissue." He is following up with his urologist in February.   His asthma remains well controlled. He is doing well and has no complaints.    Review of Systems  Constitutional: Negative for chills, fever and unexpected weight change.  HENT: Positive for congestion.   Respiratory: Negative for cough, chest tightness, shortness of breath and wheezing.   Cardiovascular: Negative for chest pain.  Gastrointestinal: Negative for constipation, diarrhea, nausea and vomiting.  Genitourinary: Negative for dysuria.  Neurological: Negative for dizziness, weakness and headaches.   Patient Active Problem List   Diagnosis Date Noted  . Nonallopathic lesion of lumbosacral region 07/26/2017  . Nonallopathic lesion of sacral region 07/26/2017  . Nonallopathic lesion of thoracic region 07/26/2017  . Vitamin D deficiency 06/04/2017  . Pelvic floor dysfunction 02/13/2017  . Lumbar disc herniation with radiculopathy 01/16/2017  . Balanitis 02/20/2013  . Asthma    Prior to Admission medications   Medication Sig Start Date End Date Taking? Authorizing Provider  albuterol (PROVENTIL HFA;VENTOLIN HFA) 108 (90 Base) MCG/ACT inhaler  Inhale 2 puffs into the lungs every 6 (six) hours as needed for wheezing. 12/10/17  Yes Jeffery, Chelle, PA-C  beclomethasone (QVAR) 80 MCG/ACT inhaler Inhale 1 puff into the lungs daily. 12/10/17  Yes Jeffery, Chelle, PA-C  cetirizine (ZYRTEC) 10 MG tablet Take 10 mg by mouth daily.   Yes [provider]  gabapentin (NEURONTIN) 300 MG capsule Take one capsules QAM and midday, take 2-3 capsules at HS Patient taking differently: Take 600 mg by mouth at bedtime. Take one capsules QAM and midday, take 2-3 capsules at HS 05/08/17  Yes Jeffery, Chelle, PA-C  Multiple Vitamin (MULTIVITAMIN WITH MINERALS) TABS tablet Take 1 tablet by mouth daily.   Yes [provider]   Allergies  Allergen Reactions  . Ibuprofen      Objective:   Physical Exam  Constitutional: He is oriented to person, place, and time. He appears well-developed and well-nourished.  Cardiovascular: Normal rate, regular rhythm, normal heart sounds and intact distal pulses.  Pulses:      Radial pulses are 2+ on the right side, and 2+ on the left side.       Posterior tibial pulses are 2+ on the right side, and 2+ on the left side.  Pulmonary/Chest: Effort normal and breath sounds normal.  Lymphadenopathy:    He has no cervical adenopathy.  Neurological: He is alert and oriented to person, place, and time.  Psychiatric: He has a normal mood and affect. His behavior is normal.      Assessment & Plan:  1. Mild intermittent asthma without complication Continue current medications.   - beclomethasone (  QVAR) 80 MCG/ACT inhaler; Inhale 1 puff into the lungs daily.  Dispense: 8.7 g; Refill: 5 - albuterol (PROVENTIL HFA;VENTOLIN HFA) 108 (90 Base) MCG/ACT inhaler; Inhale 2 puffs into the lungs every 6 (six) hours as needed for wheezing.  Dispense: 18 g; Refill: 1  2. Lumbar disc herniation with radiculopathy 3. Nonallopathic lesion of lumbosacral region 4. Pelvic floor dysfunction 5. Nonallopathic lesion of sacral  region 6. Nonallopathic lesion of thoracic region Continue treatment with stretching and PT. Patient considering with epidural steroid injection.   Return if symptoms worsen or fail to improve.  Alfonse Alpersaroline Shaelynn Dragos, PA-S

## 2018-01-17 DIAGNOSIS — M545 Low back pain: Secondary | ICD-10-CM | POA: Diagnosis not present

## 2018-01-17 DIAGNOSIS — M5432 Sciatica, left side: Secondary | ICD-10-CM | POA: Diagnosis not present

## 2018-01-17 DIAGNOSIS — N5082 Scrotal pain: Secondary | ICD-10-CM | POA: Diagnosis not present

## 2018-01-22 DIAGNOSIS — N5082 Scrotal pain: Secondary | ICD-10-CM | POA: Diagnosis not present

## 2018-01-22 DIAGNOSIS — M545 Low back pain: Secondary | ICD-10-CM | POA: Diagnosis not present

## 2018-01-22 DIAGNOSIS — M5432 Sciatica, left side: Secondary | ICD-10-CM | POA: Diagnosis not present

## 2018-01-27 DIAGNOSIS — M5432 Sciatica, left side: Secondary | ICD-10-CM | POA: Diagnosis not present

## 2018-01-27 DIAGNOSIS — M545 Low back pain: Secondary | ICD-10-CM | POA: Diagnosis not present

## 2018-01-27 DIAGNOSIS — N5082 Scrotal pain: Secondary | ICD-10-CM | POA: Diagnosis not present

## 2018-02-03 DIAGNOSIS — M545 Low back pain: Secondary | ICD-10-CM | POA: Diagnosis not present

## 2018-02-03 DIAGNOSIS — M5432 Sciatica, left side: Secondary | ICD-10-CM | POA: Diagnosis not present

## 2018-02-03 DIAGNOSIS — N5082 Scrotal pain: Secondary | ICD-10-CM | POA: Diagnosis not present

## 2018-02-10 DIAGNOSIS — M545 Low back pain: Secondary | ICD-10-CM | POA: Diagnosis not present

## 2018-02-10 DIAGNOSIS — M5432 Sciatica, left side: Secondary | ICD-10-CM | POA: Diagnosis not present

## 2018-02-10 DIAGNOSIS — N5082 Scrotal pain: Secondary | ICD-10-CM | POA: Diagnosis not present

## 2018-02-20 DIAGNOSIS — N5082 Scrotal pain: Secondary | ICD-10-CM | POA: Diagnosis not present

## 2018-02-20 DIAGNOSIS — M545 Low back pain: Secondary | ICD-10-CM | POA: Diagnosis not present

## 2018-02-20 DIAGNOSIS — M5432 Sciatica, left side: Secondary | ICD-10-CM | POA: Diagnosis not present

## 2018-02-25 DIAGNOSIS — N5082 Scrotal pain: Secondary | ICD-10-CM | POA: Diagnosis not present

## 2018-02-25 DIAGNOSIS — M5432 Sciatica, left side: Secondary | ICD-10-CM | POA: Diagnosis not present

## 2018-02-25 DIAGNOSIS — M545 Low back pain: Secondary | ICD-10-CM | POA: Diagnosis not present

## 2018-02-27 ENCOUNTER — Encounter: Payer: Self-pay | Admitting: Physician Assistant

## 2018-03-04 DIAGNOSIS — M545 Low back pain: Secondary | ICD-10-CM | POA: Diagnosis not present

## 2018-03-04 DIAGNOSIS — M5432 Sciatica, left side: Secondary | ICD-10-CM | POA: Diagnosis not present

## 2018-03-04 DIAGNOSIS — N5082 Scrotal pain: Secondary | ICD-10-CM | POA: Diagnosis not present

## 2018-03-12 DIAGNOSIS — N5082 Scrotal pain: Secondary | ICD-10-CM | POA: Diagnosis not present

## 2018-03-12 DIAGNOSIS — M545 Low back pain: Secondary | ICD-10-CM | POA: Diagnosis not present

## 2018-03-12 DIAGNOSIS — M5432 Sciatica, left side: Secondary | ICD-10-CM | POA: Diagnosis not present

## 2018-03-18 DIAGNOSIS — N5082 Scrotal pain: Secondary | ICD-10-CM | POA: Diagnosis not present

## 2018-03-18 DIAGNOSIS — M5432 Sciatica, left side: Secondary | ICD-10-CM | POA: Diagnosis not present

## 2018-03-18 DIAGNOSIS — M545 Low back pain: Secondary | ICD-10-CM | POA: Diagnosis not present

## 2018-03-25 DIAGNOSIS — N5082 Scrotal pain: Secondary | ICD-10-CM | POA: Diagnosis not present

## 2018-03-25 DIAGNOSIS — M5432 Sciatica, left side: Secondary | ICD-10-CM | POA: Diagnosis not present

## 2018-03-25 DIAGNOSIS — M545 Low back pain: Secondary | ICD-10-CM | POA: Diagnosis not present

## 2018-04-02 DIAGNOSIS — M545 Low back pain: Secondary | ICD-10-CM | POA: Diagnosis not present

## 2018-04-02 DIAGNOSIS — M5432 Sciatica, left side: Secondary | ICD-10-CM | POA: Diagnosis not present

## 2018-04-02 DIAGNOSIS — N5082 Scrotal pain: Secondary | ICD-10-CM | POA: Diagnosis not present

## 2018-04-07 DIAGNOSIS — M545 Low back pain: Secondary | ICD-10-CM | POA: Diagnosis not present

## 2018-04-07 DIAGNOSIS — M5432 Sciatica, left side: Secondary | ICD-10-CM | POA: Diagnosis not present

## 2018-04-07 DIAGNOSIS — N5082 Scrotal pain: Secondary | ICD-10-CM | POA: Diagnosis not present

## 2018-04-09 ENCOUNTER — Encounter: Payer: Self-pay | Admitting: Physician Assistant

## 2018-04-09 ENCOUNTER — Ambulatory Visit: Payer: BLUE CROSS/BLUE SHIELD | Admitting: Physician Assistant

## 2018-04-09 ENCOUNTER — Other Ambulatory Visit: Payer: Self-pay

## 2018-04-09 VITALS — BP 100/66 | HR 62 | Temp 98.8°F | Resp 16 | Ht 69.0 in | Wt 166.0 lb

## 2018-04-09 DIAGNOSIS — Z23 Encounter for immunization: Secondary | ICD-10-CM | POA: Diagnosis not present

## 2018-04-09 DIAGNOSIS — J4520 Mild intermittent asthma, uncomplicated: Secondary | ICD-10-CM | POA: Diagnosis not present

## 2018-04-09 DIAGNOSIS — M5116 Intervertebral disc disorders with radiculopathy, lumbar region: Secondary | ICD-10-CM | POA: Diagnosis not present

## 2018-04-09 DIAGNOSIS — M6289 Other specified disorders of muscle: Secondary | ICD-10-CM | POA: Diagnosis not present

## 2018-04-09 DIAGNOSIS — M999 Biomechanical lesion, unspecified: Secondary | ICD-10-CM

## 2018-04-09 MED ORDER — CYCLOBENZAPRINE HCL 10 MG PO TABS
5.0000 mg | ORAL_TABLET | Freq: Three times a day (TID) | ORAL | 5 refills | Status: AC | PRN
Start: 2018-04-09 — End: ?

## 2018-04-09 MED ORDER — BECLOMETHASONE DIPROPIONATE 80 MCG/ACT IN AERS: 1.0000 | INHALATION_SPRAY | Freq: Every day | RESPIRATORY_TRACT | 5 refills | Status: AC

## 2018-04-09 MED ORDER — ALBUTEROL SULFATE HFA 108 (90 BASE) MCG/ACT IN AERS: 2.0000 | INHALATION_SPRAY | Freq: Four times a day (QID) | RESPIRATORY_TRACT | 1 refills | Status: AC | PRN

## 2018-04-09 NOTE — Progress Notes (Signed)
Patient ID: Craig [facility], male    DOB: 1985/12/05, 32 y.o.   MRN: [id number]  PCP: Porfirio Oar, PA-C  Chief Complaint  Patient presents with  . Asthma    follow up , mild   . Back Pain    follow up , still doing PT every week     Subjective:   Presents for evaluation of back pain and asthma. I last saw him 11/2017.  He has continued to do well.  No episodes of asthma. Loves teaching. Had a great Spring 2019.  Has received his tetanus booster. Continues with PT. Has likely achieved maximum benefit, but continues to have episodic flares with LEFT leg muscle spasm, especially in the glute and great toe. Plans to go back to Dr. Terrilee Files, as he desires to get back in shape, and needs an "all clear."  Balanitis resolved. 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 Recently exposed to marijuana, and started to develop similar symptoms.  Suspects that was more likely cause of his problem, now resolved.  Review of Systems As above.    Patient Active Problem List   Diagnosis Date Noted  . Nonallopathic lesion of lumbosacral region 07/26/2017  . Nonallopathic lesion of sacral region 07/26/2017  . Nonallopathic lesion of thoracic region 07/26/2017  . Vitamin D deficiency 06/04/2017  . Pelvic floor dysfunction 02/13/2017  . Lumbar disc herniation with radiculopathy 01/16/2017  . Balanitis 02/20/2013  . Asthma      Prior to Admission medications   Medication Sig Start Date End Date Taking? Authorizing Provider  albuterol (PROVENTIL HFA;VENTOLIN HFA) 108 (90 Base) MCG/ACT inhaler Inhale 2 puffs into the lungs every 6 (six) hours as needed for wheezing. 12/10/17  Yes [name], PA-C  beclomethasone (QVAR) 80 MCG/ACT inhaler Inhale 1 puff into the lungs daily. 12/10/17  Yes [name], PA-C  cetirizine (ZYRTEC) 10 MG tablet Take 10 mg by mouth daily.   Yes [provider]  Multiple Vitamin (MULTIVITAMIN WITH MINERALS) TABS tablet Take 1  tablet by mouth daily.   Yes [provider]     Allergies  Allergen Reactions  . Ibuprofen        Objective:  Physical Exam  Constitutional: He is oriented to person, place, and time. He appears well-developed and well-nourished. He is active and cooperative. No distress.  BP 100/66   Pulse 62   Temp 98.8 F (37.1 C)   Resp 16   Ht  (1.753 m)   Wt 166 lb (75.3 kg)   SpO2 100%   BMI 24.51 kg/m   HENT:  Head: Normocephalic and atraumatic.  Right Ear: Hearing normal.  Left Ear: Hearing normal.  Eyes: Conjunctivae are normal. No scleral icterus.  Neck: Normal range of motion. Neck supple. No thyromegaly present.  Cardiovascular: Normal rate, regular rhythm and normal heart sounds.  Pulses:      Radial pulses are 2+ on the right side, and 2+ on the left side.  Pulmonary/Chest: Effort normal and breath sounds normal.  Lymphadenopathy:       Head (right side): No tonsillar, no preauricular, no posterior auricular and no occipital adenopathy present.       Head (left side): No tonsillar, no preauricular, no posterior auricular and no occipital adenopathy present.    He has no cervical adenopathy.       Right: No supraclavicular adenopathy present.       Left: No supraclavicular adenopathy present.  Neurological: He is  alert and oriented to person, place, and time. No sensory deficit.  Skin: Skin is warm, dry and intact. No rash noted. No cyanosis or erythema. Nails show no clubbing.  Psychiatric: He has a normal mood and affect. His speech is normal and behavior is normal.   Wt Readings from Last 3 Encounters:  04/09/18 166 lb (75.3 kg)  12/10/17 162 lb 6.4 oz (73.7 kg)  09/09/17 152 lb (68.9 kg)      Assessment & Plan:   Problem List Items Addressed This Visit    Asthma - Primary    Stable and well-controlled.  No changes.      Relevant Medications   albuterol (PROVENTIL HFA;VENTOLIN HFA) 108 (90 Base) MCG/ACT inhaler   beclomethasone (QVAR) 80  MCG/ACT inhaler   Lumbar disc herniation with radiculopathy    Stable overall.  Continue with his home exercise program, physical therapy, cyclobenzaprine as needed.      Relevant Medications   cyclobenzaprine (FLEXERIL) 10 MG tablet   Pelvic floor dysfunction    Stable overall.  Continue PT.      Nonallopathic lesion of lumbosacral region    Stable overall.  Continue PT, home exercise and stretching, cyclobenzaprine.          Return in about 6 months (around 10/10/2018) for re-evalaution of asthma, pelvic floor dysfunction.   Fernande Bras, PA-C Primary Care at [facility]

## 2018-04-09 NOTE — Patient Instructions (Addendum)
Go ahead and call [facility] to schedule your next visit with me there. [phone].  EYE Specialists: [facility]  [id number], [place], [place] [address]  Phone: [phone]  [facility] [address] [place], [place], [place] [address]  Phone: [phone]   IF you received an x-ray today, you will receive an invoice from [facility]. Please contact [facility] at [phone] with questions or concerns regarding your invoice.   IF you received labwork today, you will receive an invoice from [facility]. Please contact [facility] at [phone] with questions or concerns regarding your invoice.   Our billing staff will not be able to assist you with questions regarding bills from these companies.  You will be contacted with the lab results as soon as they are available. The fastest way to get your results is to activate your My Chart account. Instructions are located on the last page of this paperwork. If you have not heard from [place] regarding the results in 2 weeks, please contact this office.

## 2018-04-12 NOTE — Assessment & Plan Note (Signed)
Stable overall.  Continue PT.

## 2018-04-12 NOTE — Assessment & Plan Note (Signed)
Stable and well-controlled.  No changes.

## 2018-04-12 NOTE — Assessment & Plan Note (Signed)
Stable overall.  Continue PT, home exercise and stretching, cyclobenzaprine.

## 2018-04-12 NOTE — Assessment & Plan Note (Signed)
Stable overall.  Continue with his home exercise program, physical therapy, cyclobenzaprine as needed.

## 2018-04-14 DIAGNOSIS — M545 Low back pain: Secondary | ICD-10-CM | POA: Diagnosis not present

## 2018-04-14 DIAGNOSIS — N5082 Scrotal pain: Secondary | ICD-10-CM | POA: Diagnosis not present

## 2018-04-14 DIAGNOSIS — M5432 Sciatica, left side: Secondary | ICD-10-CM | POA: Diagnosis not present

## 2018-04-24 DIAGNOSIS — M545 Low back pain: Secondary | ICD-10-CM | POA: Diagnosis not present

## 2018-04-24 DIAGNOSIS — N5082 Scrotal pain: Secondary | ICD-10-CM | POA: Diagnosis not present

## 2018-04-24 DIAGNOSIS — M5432 Sciatica, left side: Secondary | ICD-10-CM | POA: Diagnosis not present

## 2018-04-28 DIAGNOSIS — M545 Low back pain: Secondary | ICD-10-CM | POA: Diagnosis not present

## 2018-04-28 DIAGNOSIS — M5432 Sciatica, left side: Secondary | ICD-10-CM | POA: Diagnosis not present

## 2018-04-28 DIAGNOSIS — N5082 Scrotal pain: Secondary | ICD-10-CM | POA: Diagnosis not present

## 2018-05-05 DIAGNOSIS — M545 Low back pain: Secondary | ICD-10-CM | POA: Diagnosis not present

## 2018-05-05 DIAGNOSIS — N5082 Scrotal pain: Secondary | ICD-10-CM | POA: Diagnosis not present

## 2018-05-05 DIAGNOSIS — M5432 Sciatica, left side: Secondary | ICD-10-CM | POA: Diagnosis not present

## 2018-05-15 DIAGNOSIS — M5432 Sciatica, left side: Secondary | ICD-10-CM | POA: Diagnosis not present

## 2018-05-15 DIAGNOSIS — M545 Low back pain: Secondary | ICD-10-CM | POA: Diagnosis not present

## 2018-05-15 DIAGNOSIS — N5082 Scrotal pain: Secondary | ICD-10-CM | POA: Diagnosis not present

## 2018-05-26 DIAGNOSIS — M545 Low back pain: Secondary | ICD-10-CM | POA: Diagnosis not present

## 2018-05-26 DIAGNOSIS — N5082 Scrotal pain: Secondary | ICD-10-CM | POA: Diagnosis not present

## 2018-05-26 DIAGNOSIS — M5432 Sciatica, left side: Secondary | ICD-10-CM | POA: Diagnosis not present

## 2018-06-02 DIAGNOSIS — M5432 Sciatica, left side: Secondary | ICD-10-CM | POA: Diagnosis not present

## 2018-06-02 DIAGNOSIS — M545 Low back pain: Secondary | ICD-10-CM | POA: Diagnosis not present

## 2018-06-02 DIAGNOSIS — N5082 Scrotal pain: Secondary | ICD-10-CM | POA: Diagnosis not present

## 2018-07-29 DIAGNOSIS — N5082 Scrotal pain: Secondary | ICD-10-CM | POA: Diagnosis not present

## 2018-07-29 DIAGNOSIS — M5432 Sciatica, left side: Secondary | ICD-10-CM | POA: Diagnosis not present

## 2018-07-29 DIAGNOSIS — M545 Low back pain: Secondary | ICD-10-CM | POA: Diagnosis not present

## 2018-08-12 ENCOUNTER — Emergency Department (HOSPITAL_COMMUNITY): Payer: No Typology Code available for payment source

## 2018-08-12 ENCOUNTER — Emergency Department (HOSPITAL_COMMUNITY)
Admission: EM | Admit: 2018-08-12 | Discharge: 2018-08-12 | Disposition: A | Discharge status: Home / Self Care | Payer: No Typology Code available for payment source | Attending: Emergency Medicine | Admitting: Emergency Medicine

## 2018-08-12 ENCOUNTER — Other Ambulatory Visit: Payer: Self-pay

## 2018-08-12 ENCOUNTER — Encounter (HOSPITAL_COMMUNITY): Payer: Self-pay

## 2018-08-12 DIAGNOSIS — R200 Anesthesia of skin: Secondary | ICD-10-CM | POA: Diagnosis not present

## 2018-08-12 DIAGNOSIS — J45909 Unspecified asthma, uncomplicated: Secondary | ICD-10-CM | POA: Diagnosis not present

## 2018-08-12 DIAGNOSIS — S3992XA Unspecified injury of lower back, initial encounter: Secondary | ICD-10-CM | POA: Diagnosis not present

## 2018-08-12 DIAGNOSIS — Y999 Unspecified external cause status: Secondary | ICD-10-CM | POA: Diagnosis not present

## 2018-08-12 DIAGNOSIS — Y92410 Unspecified street and highway as the place of occurrence of the external cause: Secondary | ICD-10-CM | POA: Diagnosis not present

## 2018-08-12 DIAGNOSIS — Y9389 Activity, other specified: Secondary | ICD-10-CM | POA: Insufficient documentation

## 2018-08-12 DIAGNOSIS — M545 Low back pain: Secondary | ICD-10-CM

## 2018-08-12 LAB — COMPREHENSIVE METABOLIC PANEL
ALBUMIN: 5.1 g/dL — AB (ref 3.5–5.0)
ALT: 19 U/L (ref 0–44)
ANION GAP: 11 (ref 5–15)
AST: 20 U/L (ref 15–41)
Alkaline Phosphatase: 55 U/L (ref 38–126)
BILIRUBIN TOTAL: 1.8 mg/dL — AB (ref 0.3–1.2)
BUN: 12 mg/dL (ref 6–20)
CO2: 26 mmol/L (ref 22–32)
Calcium: 9.4 mg/dL (ref 8.9–10.3)
Chloride: 103 mmol/L (ref 98–111)
Creatinine, Ser: 0.93 mg/dL (ref 0.61–1.24)
GFR calc Af Amer: >60 mL/min (ref >60)
Glucose, Bld: 112 mg/dL — ABNORMAL HIGH (ref 70–99)
Potassium: 3.4 mmol/L — ABNORMAL LOW (ref 3.5–5.1)
Sodium: 140 mmol/L (ref 135–145)
TOTAL PROTEIN: 7.9 g/dL (ref 6.5–8.1)

## 2018-08-12 LAB — CBC
HEMATOCRIT: 47.8 % (ref 39.0–52.0)
Hemoglobin: 17.4 g/dL — ABNORMAL HIGH (ref 13.0–17.0)
MCH: 31.5 pg (ref 26.0–34.0)
MCHC: 36.4 g/dL — AB (ref 30.0–36.0)
MCV: 86.4 fL (ref 78.0–100.0)
PLATELETS: 264 10*3/uL (ref 150–400)
RBC: 5.53 MIL/uL (ref 4.22–5.81)
RDW: 12.2 % (ref 11.5–15.5)
WBC: 7.9 10*3/uL (ref 4.0–10.5)

## 2018-08-12 MED ORDER — CYCLOBENZAPRINE HCL 10 MG PO TABS
10.0000 mg | ORAL_TABLET | Freq: Once | ORAL | Status: AC
  Administered 2018-08-12: 10 mg via ORAL
  Filled 2018-08-12: qty 1

## 2018-08-12 NOTE — Discharge Instructions (Addendum)
You were evaluated in the Emergency Department and after careful evaluation, we did not find any emergent condition requiring admission or further testing in the hospital.  Please return to the Emergency Department if you experience any worsening of your condition.  We encourage you to follow up with your regular providers.  Thank you for allowing us to be a part of your care.
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
## 2018-08-12 NOTE — ED Notes (Signed)
Patient returned from MRI.

## 2018-08-12 NOTE — ED Notes (Signed)
Patient transported to MRI 

## 2018-08-26 DIAGNOSIS — N5082 Scrotal pain: Secondary | ICD-10-CM | POA: Diagnosis not present

## 2018-08-26 DIAGNOSIS — M5432 Sciatica, left side: Secondary | ICD-10-CM | POA: Diagnosis not present

## 2018-08-26 DIAGNOSIS — M545 Low back pain: Secondary | ICD-10-CM | POA: Diagnosis not present

## 2018-09-02 DIAGNOSIS — M545 Low back pain: Secondary | ICD-10-CM | POA: Diagnosis not present

## 2018-09-02 DIAGNOSIS — N5082 Scrotal pain: Secondary | ICD-10-CM | POA: Diagnosis not present

## 2018-09-02 DIAGNOSIS — M5432 Sciatica, left side: Secondary | ICD-10-CM | POA: Diagnosis not present

## 2018-11-05 DIAGNOSIS — M5432 Sciatica, left side: Secondary | ICD-10-CM | POA: Diagnosis not present

## 2018-11-05 DIAGNOSIS — M545 Low back pain: Secondary | ICD-10-CM | POA: Diagnosis not present

## 2018-11-05 DIAGNOSIS — N5082 Scrotal pain: Secondary | ICD-10-CM | POA: Diagnosis not present

## 2018-11-11 DIAGNOSIS — M5432 Sciatica, left side: Secondary | ICD-10-CM | POA: Diagnosis not present

## 2018-11-11 DIAGNOSIS — N5082 Scrotal pain: Secondary | ICD-10-CM | POA: Diagnosis not present

## 2018-11-11 DIAGNOSIS — M545 Low back pain: Secondary | ICD-10-CM | POA: Diagnosis not present

## 2018-11-27 DIAGNOSIS — M5432 Sciatica, left side: Secondary | ICD-10-CM | POA: Diagnosis not present

## 2018-11-27 DIAGNOSIS — N5082 Scrotal pain: Secondary | ICD-10-CM | POA: Diagnosis not present

## 2018-11-27 DIAGNOSIS — M545 Low back pain: Secondary | ICD-10-CM | POA: Diagnosis not present

## 2018-12-03 DIAGNOSIS — N5082 Scrotal pain: Secondary | ICD-10-CM | POA: Diagnosis not present

## 2018-12-03 DIAGNOSIS — M545 Low back pain: Secondary | ICD-10-CM | POA: Diagnosis not present

## 2018-12-03 DIAGNOSIS — M5432 Sciatica, left side: Secondary | ICD-10-CM | POA: Diagnosis not present

## 2018-12-11 DIAGNOSIS — N5082 Scrotal pain: Secondary | ICD-10-CM | POA: Diagnosis not present

## 2018-12-11 DIAGNOSIS — M5432 Sciatica, left side: Secondary | ICD-10-CM | POA: Diagnosis not present

## 2018-12-11 DIAGNOSIS — M545 Low back pain: Secondary | ICD-10-CM | POA: Diagnosis not present

## 2018-12-15 DIAGNOSIS — M5432 Sciatica, left side: Secondary | ICD-10-CM | POA: Diagnosis not present

## 2018-12-15 DIAGNOSIS — N5082 Scrotal pain: Secondary | ICD-10-CM | POA: Diagnosis not present

## 2018-12-15 DIAGNOSIS — M545 Low back pain: Secondary | ICD-10-CM | POA: Diagnosis not present

## 2018-12-25 DIAGNOSIS — M5432 Sciatica, left side: Secondary | ICD-10-CM | POA: Diagnosis not present

## 2018-12-25 DIAGNOSIS — M545 Low back pain: Secondary | ICD-10-CM | POA: Diagnosis not present

## 2018-12-25 DIAGNOSIS — N5082 Scrotal pain: Secondary | ICD-10-CM | POA: Diagnosis not present

## 2018-12-26 IMAGING — US US ART/VEN ABD/PELV/SCROTUM DOPPLER LTD
1 series · 13 of 25 positions shown · non-contrast
Comparison: CT abdomen pelvis of 01/29/2017

CLINICAL DATA: Testicular pain

EXAM:
ULTRASOUND OF SCROTUM
TECHNIQUE: Complete ultrasound examination of the testicles, epididymis, and
other scrotal structures was performed.

[Series 1: us art/ven abd/pelv/scrotum doppler ltd · 0.05mm/px · 13 of 80 slices shown]
[im 1/80]
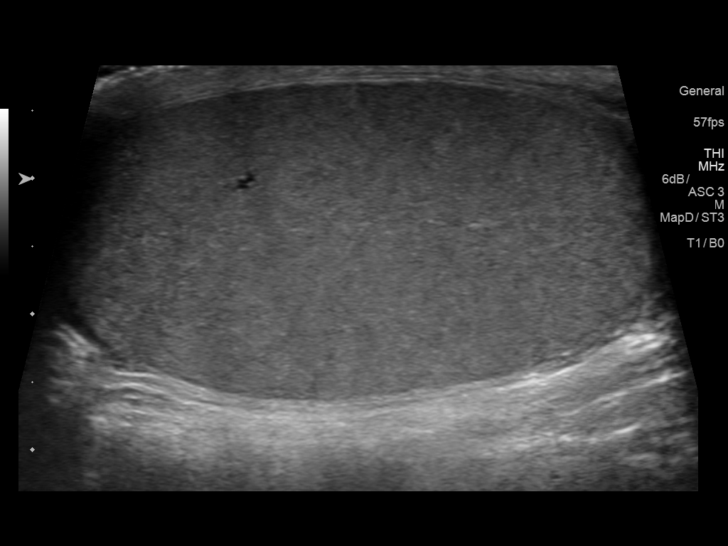
[im 7/80]
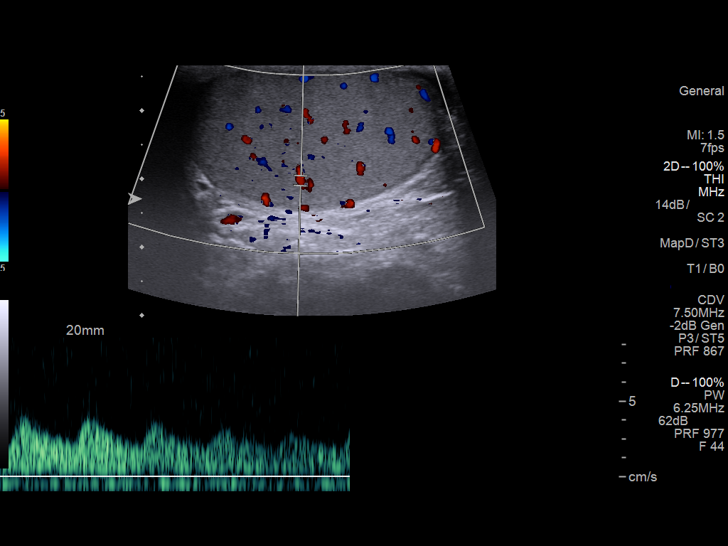
[im 14/80]
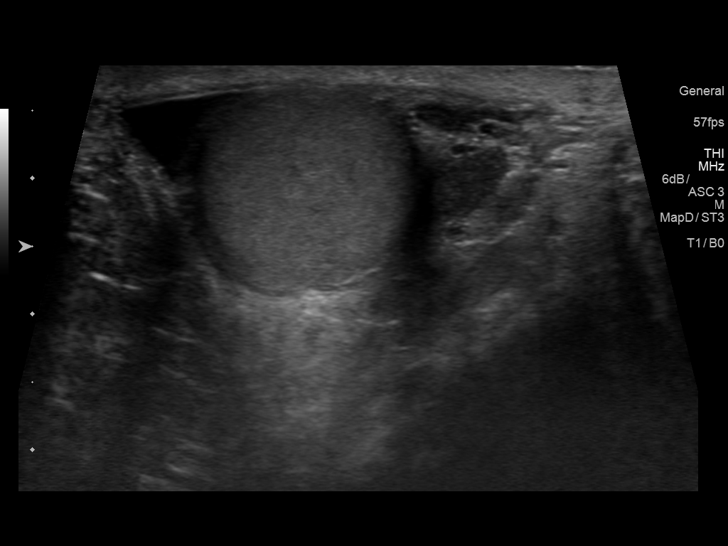
[im 20/80]
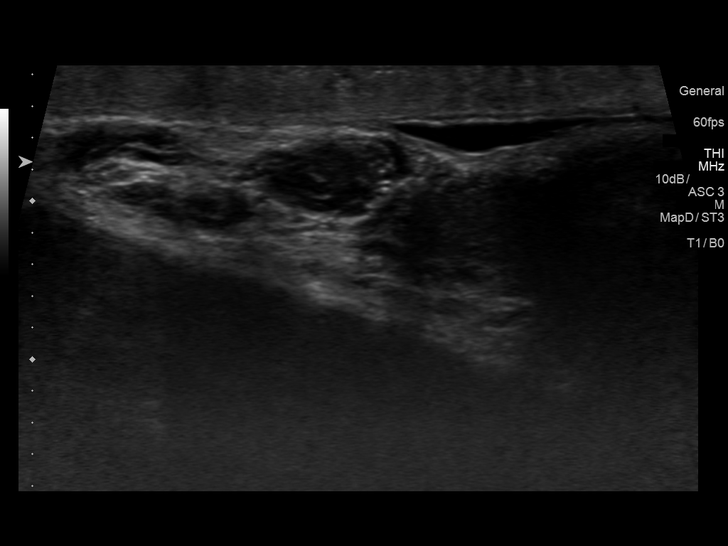
[im 27/80]
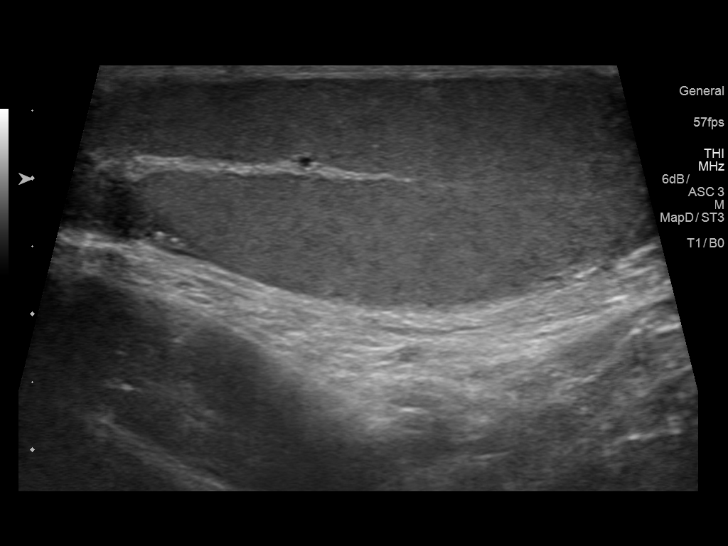
[im 33/80]
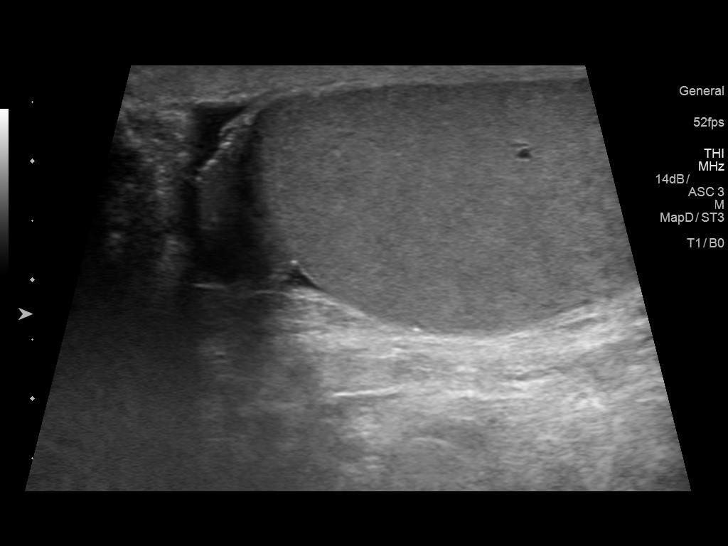
[im 40/80]
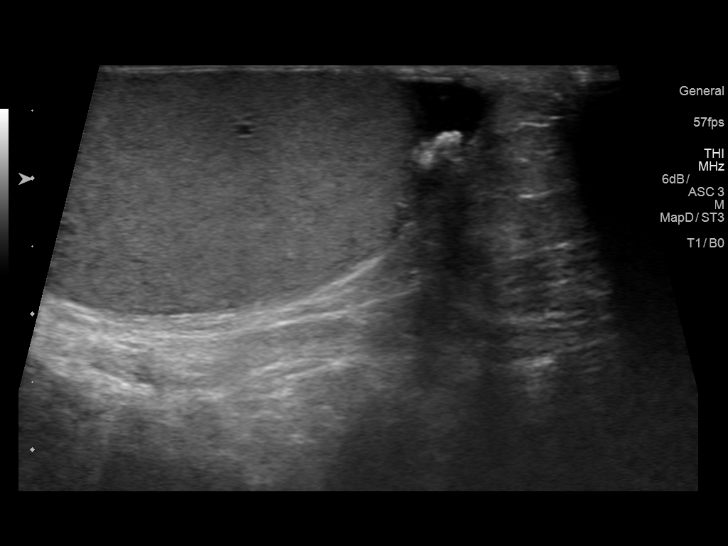
[im 47/80]
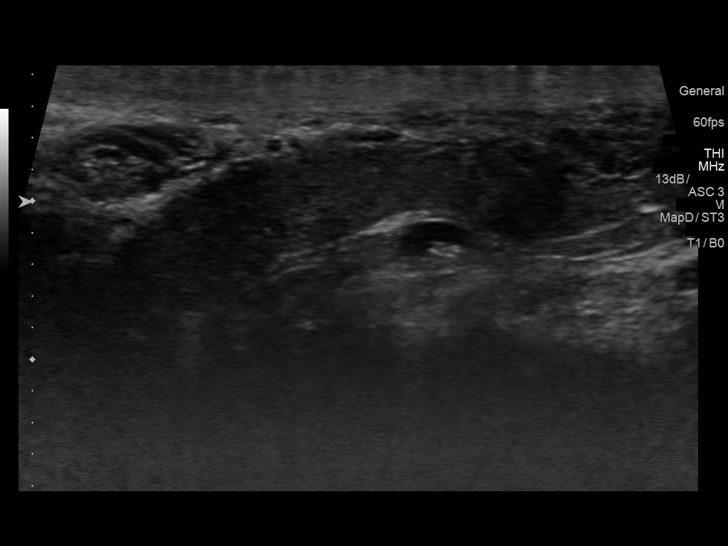
[im 53/80]
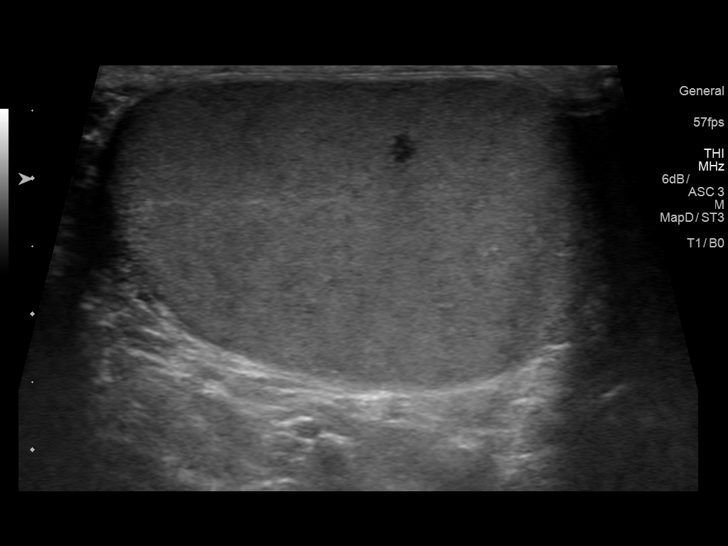
[im 60/80]
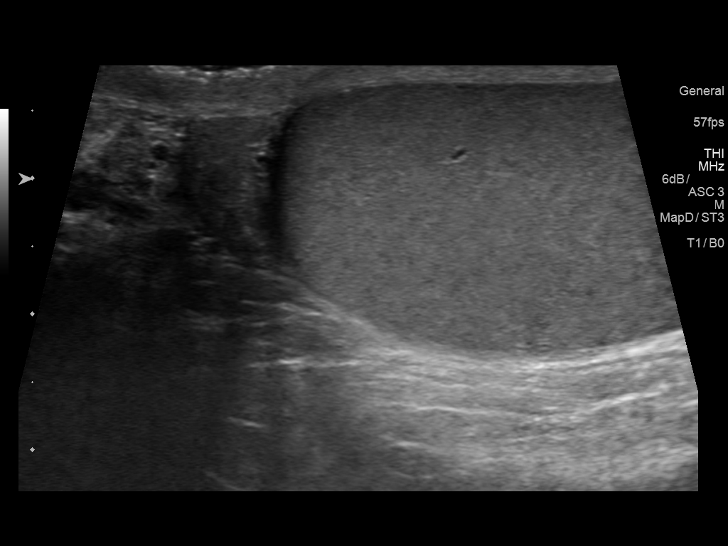
[im 66/80]
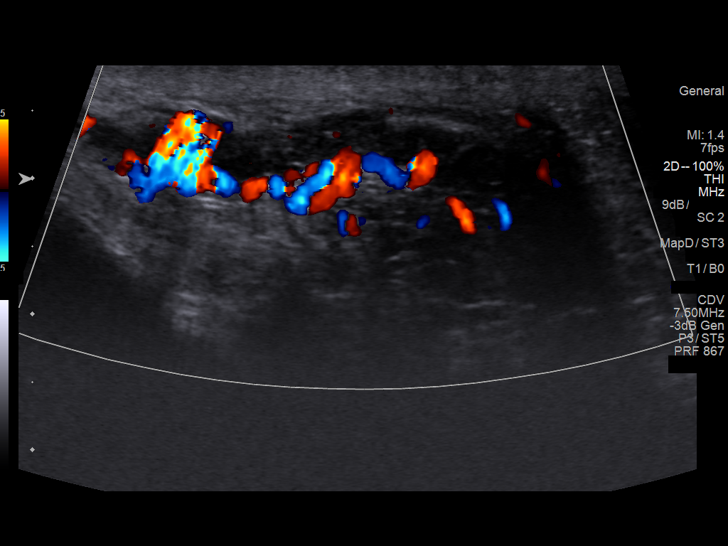
[im 73/80]
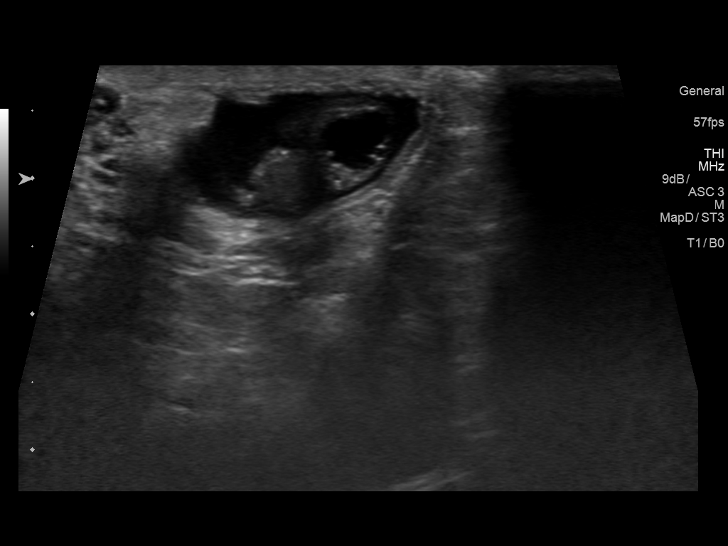
[im 80/80]
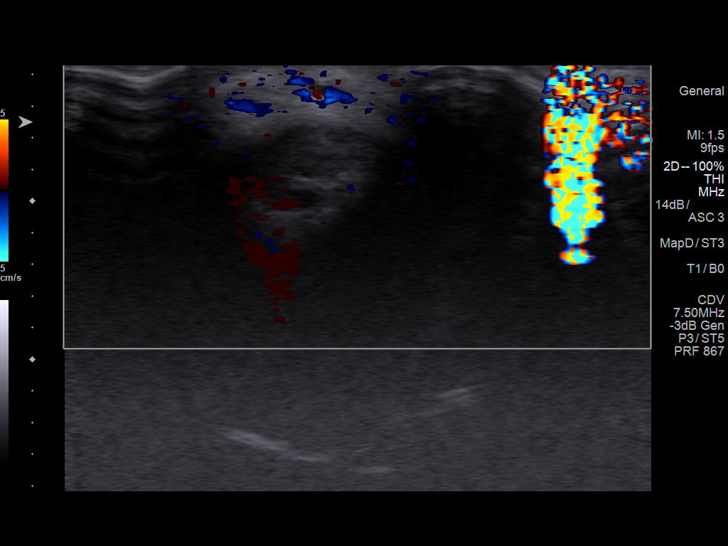

[13 of 25 positions shown; findings below may reference images not displayed]

FINDINGS: Right testicle

Measurements: The right testicle measures 4.3 x 2.3 x 2.7 cm.. No
intratesticular abnormality is seen. Blood flow is demonstrated to
the right testicle with arterial and venous waveforms.

Left testicle

Measurements: The left testicle measures 3.4 x 1.9 x 2.9 cm.. No
intratesticular abnormality is noted. Blood flow is demonstrated to
the left testicle with 0 Christian and venous waveforms.

Right epididymis:  Right epididymis is unremarkable.

Left epididymis:  A 5 mm left epididymal cyst is present.

Hydrocele: There is a left hydrocele present with some internal
debris. Echogenic nodules layering within the hydrocele with
acoustical shadowing are consistent with scrotal pearls which are
generally considered a benign process and are often associated with
hydrocele.

Varicocele:  No varicocele is seen.
IMPRESSION: 1. No intratesticular abnormality is seen. Blood flow is
demonstrated to both testicles.
2. Left hydrocele with left scrotal pearls which are considered
benign.

## 2018-12-30 DIAGNOSIS — M5432 Sciatica, left side: Secondary | ICD-10-CM | POA: Diagnosis not present

## 2018-12-30 DIAGNOSIS — N5082 Scrotal pain: Secondary | ICD-10-CM | POA: Diagnosis not present

## 2018-12-30 DIAGNOSIS — M545 Low back pain: Secondary | ICD-10-CM | POA: Diagnosis not present

## 2019-01-08 DIAGNOSIS — N5082 Scrotal pain: Secondary | ICD-10-CM | POA: Diagnosis not present

## 2019-01-08 DIAGNOSIS — M545 Low back pain: Secondary | ICD-10-CM | POA: Diagnosis not present

## 2019-01-08 DIAGNOSIS — M5432 Sciatica, left side: Secondary | ICD-10-CM | POA: Diagnosis not present

## 2019-01-13 DIAGNOSIS — N5082 Scrotal pain: Secondary | ICD-10-CM | POA: Diagnosis not present

## 2019-01-13 DIAGNOSIS — M545 Low back pain: Secondary | ICD-10-CM | POA: Diagnosis not present

## 2019-01-13 DIAGNOSIS — M5432 Sciatica, left side: Secondary | ICD-10-CM | POA: Diagnosis not present

## 2019-01-20 DIAGNOSIS — M5432 Sciatica, left side: Secondary | ICD-10-CM | POA: Diagnosis not present

## 2019-01-20 DIAGNOSIS — M545 Low back pain: Secondary | ICD-10-CM | POA: Diagnosis not present

## 2019-01-20 DIAGNOSIS — N5082 Scrotal pain: Secondary | ICD-10-CM | POA: Diagnosis not present

## 2019-01-27 DIAGNOSIS — N5082 Scrotal pain: Secondary | ICD-10-CM | POA: Diagnosis not present

## 2019-01-27 DIAGNOSIS — M545 Low back pain: Secondary | ICD-10-CM | POA: Diagnosis not present

## 2019-01-27 DIAGNOSIS — M5432 Sciatica, left side: Secondary | ICD-10-CM | POA: Diagnosis not present

## 2019-02-03 DIAGNOSIS — M545 Low back pain: Secondary | ICD-10-CM | POA: Diagnosis not present

## 2019-02-03 DIAGNOSIS — N5082 Scrotal pain: Secondary | ICD-10-CM | POA: Diagnosis not present

## 2019-02-03 DIAGNOSIS — M5432 Sciatica, left side: Secondary | ICD-10-CM | POA: Diagnosis not present

## 2019-02-10 DIAGNOSIS — M545 Low back pain: Secondary | ICD-10-CM | POA: Diagnosis not present

## 2019-02-10 DIAGNOSIS — N5082 Scrotal pain: Secondary | ICD-10-CM | POA: Diagnosis not present

## 2019-02-10 DIAGNOSIS — M5432 Sciatica, left side: Secondary | ICD-10-CM | POA: Diagnosis not present

## 2019-02-17 DIAGNOSIS — M545 Low back pain: Secondary | ICD-10-CM | POA: Diagnosis not present

## 2019-02-17 DIAGNOSIS — M5432 Sciatica, left side: Secondary | ICD-10-CM | POA: Diagnosis not present

## 2019-02-17 DIAGNOSIS — N5082 Scrotal pain: Secondary | ICD-10-CM | POA: Diagnosis not present

## 2019-07-03 DIAGNOSIS — Z20828 Contact with and (suspected) exposure to other viral communicable diseases: Secondary | ICD-10-CM | POA: Diagnosis not present

## 2019-07-03 DIAGNOSIS — Z7189 Other specified counseling: Secondary | ICD-10-CM | POA: Diagnosis not present

## 2019-09-12 ENCOUNTER — Telehealth: Payer: No Typology Code available for payment source | Admitting: Family

## 2019-09-12 ENCOUNTER — Telehealth: Payer: BC Managed Care – PPO | Admitting: Family

## 2019-09-12 DIAGNOSIS — J4520 Mild intermittent asthma, uncomplicated: Secondary | ICD-10-CM

## 2019-09-12 MED ORDER — QVAR REDIHALER 80 MCG/ACT IN AERB: 2.0000 | INHALATION_SPRAY | Freq: Two times a day (BID) | RESPIRATORY_TRACT | 0 refills | Status: AC

## 2019-09-12 MED ORDER — PREDNISONE 20 MG PO TABS: 20.0000 mg | ORAL_TABLET | Freq: Two times a day (BID) | ORAL | 0 refills | Status: AC

## 2019-09-12 NOTE — Progress Notes (Signed)
disregard

## 2019-09-12 NOTE — Progress Notes (Signed)
E Visit for Asthma  Based on what you have shared with me, it looks like you may have a flare up of your asthma.  Asthma is a chronic (ongoing) lung disease which results in airway obstruction, inflammation and hyper-responsiveness.   Asthma symptoms vary from person to person, with common symptoms including nighttime awakening and decreased ability to participate in normal activities as a result of shortness of breath. It is often triggered by changes in weather, changes in the season, changes in air temperature, or inside (home, school, daycare or work) allergens such as animal dander, mold, mildew, woodstoves or cockroaches.   It can also be triggered by hormonal changes, extreme emotion, physical exertion or an upper respiratory tract illness.     It is important to identify the trigger, and then eliminate or avoid the trigger if possible.   If you have been prescribed medications to be taken on a regular basis, it is important to follow the asthma action plan and to follow guidelines to adjust medication in response to increasing symptoms of decreased peak expiratory flow rate  Treatment: I have prescribed: Prednisone 40mg by mouth per day for 5 - 7 days  HOME CARE . Only take medications as instructed by your medical team. . Consider wearing a mask or scarf to improve breathing air temperature have been shown to decrease irritation and decrease exacerbations . Get rest. . Taking a steamy shower or using a humidifier may help nasal congestion sand ease sore throat pain. You can place a towel over your head and breathe in the steam from hot water coming from a faucet. . Using a saline nasal spray works much the same way.  . Cough drops, hare candies and sore throat lozenges may ease your cough.  . Avoid close contacts especially the very you and the elderly . Cover your mouth if  you cough or sneeze . Always remember to wash your hands.    GET HELP RIGHT AWAY IF: . You develop worsening symptoms; breathlessness at rest, drowsy, confused or agitated, unable to speak in full sentences . You have coughing fits . You develop a severe headache or visual changes . You develop shortness of breath, difficulty breathing or start having chest pain . Your symptoms persist after you have completed your treatment plan . If your symptoms do not improve within 10 days  MAKE SURE YOU . Understand these instructions. . Will watch your condition. . Will get help right away if you are not doing well or get worse.   Your e-visit answers were reviewed by a board certified advanced clinical practitioner to complete your personal care plan, Depending upon the condition, your plan could have included both over the counter or prescription medications.  Please review your pharmacy choice. Your safety is important to us. If you have drug allergies check your prescription carefully. You can use MyChart to ask questions about today's visit, request a non-urgent call back, or ask for a work or school excuse for 24 hours related to this e-Visit. If it has been greater than 24 hours you will need to follow up with your provider, or enter a new e-Visit to address those concerns.  You will get an e-mail in the next two days asking about your experience. I hope that your e-visit has been valuable and will speed your recovery. Thank you for using e-visits.  Greater than 5 minutes, yet less than 10 minutes of time have been spent researching, coordinating, and implementing care for   this patient today.  Thank you for the details you included in the comment boxes. Those details are very helpful in determining the best course of treatment for you and help us to provide the best care.  

## 2020-02-12 ENCOUNTER — Ambulatory Visit: Payer: No Typology Code available for payment source | Attending: Internal Medicine

## 2020-02-12 DIAGNOSIS — Z23 Encounter for immunization: Secondary | ICD-10-CM

## 2020-02-12 NOTE — Progress Notes (Signed)
   Covid-19 Vaccination Clinic  Name:  Columbus Ice    MRN: [id number] DOB: [birth year]  02/12/2020  Mr. Blumenthal was observed post Covid-19 immunization for 15 minutes without incident. He was provided with Vaccine Information Sheet and instruction to access the V-Safe system.   Mr. Holtman was instructed to call 911 with any severe reactions post vaccine: Marland Kitchen Difficulty breathing  . Swelling of face and throat  . A fast heartbeat  . A bad rash all over body  . Dizziness and weakness   Immunizations Administered    Name Date Dose VIS Date Route   Pfizer COVID-19 Vaccine 02/12/2020 10:24 AM 0.3 mL 11/06/2019 Intramuscular   Manufacturer: [employer], [employer]   Lot: [id number]   NDC: [address]-0

## 2020-03-08 ENCOUNTER — Ambulatory Visit: Payer: Self-pay

## 2020-03-23 ENCOUNTER — Ambulatory Visit: Payer: Self-pay | Attending: Internal Medicine

## 2020-03-23 ENCOUNTER — Ambulatory Visit: Payer: Self-pay

## 2020-03-23 DIAGNOSIS — Z23 Encounter for immunization: Secondary | ICD-10-CM

## 2020-03-23 NOTE — Progress Notes (Signed)
   Covid-19 Vaccination Clinic  Name:  Craig Thomas    MRN: [id number] DOB: 30-Apr-1986  03/23/2020  Mr. Craig Thomas was observed post Covid-19 immunization for 15 minutes without incident. He was provided with Vaccine Information Sheet and instruction to access the V-Safe system.   Mr. Craig Thomas was instructed to call 911 with any severe reactions post vaccine: Marland Kitchen Difficulty breathing  . Swelling of face and throat  . A fast heartbeat  . A bad rash all over body  . Dizziness and weakness   Immunizations Administered    Name Date Dose VIS Date Route   Pfizer COVID-19 Vaccine 03/23/2020 12:22 PM 0.3 mL 01/20/2019 Intramuscular   Manufacturer: [employer], [employer]   Lot: [id number]   NDC: [address]-5

## 2020-07-24 ENCOUNTER — Encounter: Payer: Self-pay | Admitting: Physician Assistant

## 2020-07-24 ENCOUNTER — Telehealth: Payer: No Typology Code available for payment source | Admitting: Physician Assistant

## 2020-07-24 DIAGNOSIS — J45901 Unspecified asthma with (acute) exacerbation: Secondary | ICD-10-CM

## 2020-07-24 DIAGNOSIS — Z760 Encounter for issue of repeat prescription: Secondary | ICD-10-CM

## 2020-07-24 MED ORDER — QVAR REDIHALER 40 MCG/ACT IN AERB: 1.0000 | INHALATION_SPRAY | Freq: Every day | RESPIRATORY_TRACT | 0 refills | Status: AC

## 2020-07-24 MED ORDER — ALBUTEROL SULFATE HFA 108 (90 BASE) MCG/ACT IN AERS: 2.0000 | INHALATION_SPRAY | Freq: Four times a day (QID) | RESPIRATORY_TRACT | 0 refills | Status: AC | PRN

## 2020-07-24 MED ORDER — PREDNISONE 20 MG PO TABS: 40.0000 mg | ORAL_TABLET | Freq: Every day | ORAL | 0 refills | Status: AC

## 2020-07-24 NOTE — Progress Notes (Signed)
Visit for Asthma  Based on what you have shared with me, it looks like you may have a flare up of your asthma.  Asthma is a chronic (ongoing) lung disease which results in airway obstruction, inflammation and hyper-responsiveness.   Asthma symptoms vary from person to person, with common symptoms including nighttime awakening and decreased ability to participate in normal activities as a result of shortness of breath. It is often triggered by changes in weather, changes in the season, changes in air temperature, or inside (home, school, daycare or work) allergens such as animal dander, mold, mildew, woodstoves or cockroaches.   It can also be triggered by hormonal changes, extreme emotion, physical exertion or an upper respiratory tract illness.     It is important to identify the trigger, and then eliminate or avoid the trigger if possible.   If you have been prescribed medications to be taken on a regular basis, it is important to follow the asthma action plan and to follow guidelines to adjust medication in response to increasing symptoms of decreased peak expiratory flow rate  Treatment: I have prescribed: Albuterol (Proventil HFA; Ventolin HFA) 108 (90 Base) MCG/ACT Inhaler 2 puffs into the lungs every six hours as needed for wheezing or shortness of breath and Prednisone 40mg  by mouth per day for 5 days   I have also prescribed a refill of your Qvar as you have requested  HOME CARE . Only take medications as instructed by your medical team. . Consider wearing a mask or scarf to improve breathing air temperature have been shown to decrease irritation and decrease exacerbations . Get rest. . Taking a steamy shower or using a humidifier may help nasal congestion sand ease sore throat pain. You can place a towel over your head and breathe in the steam from hot water coming from a  faucet. . Using a saline nasal spray works much the same way.  . Cough drops, hare candies and sore throat lozenges may ease your cough.  . Avoid close contacts especially the very you and the elderly . Cover your mouth if you cough or sneeze . Always remember to wash your hands.    GET HELP RIGHT AWAY IF: . You develop worsening symptoms; breathlessness at rest, drowsy, confused or agitated, unable to speak in full sentences . You have coughing fits . You develop a severe headache or visual changes . You develop shortness of breath, difficulty breathing or start having chest pain . Your symptoms persist after you have completed your treatment plan . If your symptoms do not improve within 10 days  MAKE SURE YOU . Understand these instructions. . Will watch your condition. . Will get help right away if you are not doing well or get worse.   Your e-visit answers were reviewed by a board certified advanced clinical practitioner to complete your personal care plan, Depending upon the condition, your plan could have included both over the counter or prescription medications.  Please review your pharmacy choice. Your safety is important to . If you have drug allergies check your prescription carefully. You can use MyChart to ask questions about today's visit, request a non-urgent call back, or ask for a work or school excuse for 24 hours related to this e-Visit. If it has been greater than 24 hours you will need to follow up with your provider, or enter a new e-Visit to address those concerns.  You will get an e-mail in the next two days asking about your experience.  I hope that your e-visit has been valuable and will speed your recovery. Thank you for using e-visits. I spent 5-10 minutes on review and completion of this note- Illa Level [facility]

## 2021-02-07 DIAGNOSIS — Z03818 Encounter for observation for suspected exposure to other biological agents ruled out: Secondary | ICD-10-CM | POA: Diagnosis not present

## 2021-02-14 DIAGNOSIS — M545 Low back pain, unspecified: Secondary | ICD-10-CM | POA: Diagnosis not present

## 2021-02-22 DIAGNOSIS — M545 Low back pain, unspecified: Secondary | ICD-10-CM | POA: Diagnosis not present

## 2021-03-01 DIAGNOSIS — M545 Low back pain, unspecified: Secondary | ICD-10-CM | POA: Diagnosis not present

## 2021-03-08 DIAGNOSIS — M545 Low back pain, unspecified: Secondary | ICD-10-CM | POA: Diagnosis not present

## 2021-03-14 DIAGNOSIS — Z03818 Encounter for observation for suspected exposure to other biological agents ruled out: Secondary | ICD-10-CM | POA: Diagnosis not present

## 2021-04-10 DIAGNOSIS — M545 Low back pain, unspecified: Secondary | ICD-10-CM | POA: Diagnosis not present

## 2021-04-17 DIAGNOSIS — M545 Low back pain, unspecified: Secondary | ICD-10-CM | POA: Diagnosis not present

## 2021-04-26 DIAGNOSIS — M545 Low back pain, unspecified: Secondary | ICD-10-CM | POA: Diagnosis not present

## 2021-05-03 DIAGNOSIS — M545 Low back pain, unspecified: Secondary | ICD-10-CM | POA: Diagnosis not present

## 2021-05-10 DIAGNOSIS — M545 Low back pain, unspecified: Secondary | ICD-10-CM | POA: Diagnosis not present

## 2021-05-16 DIAGNOSIS — M545 Low back pain, unspecified: Secondary | ICD-10-CM | POA: Diagnosis not present

## 2021-05-25 DIAGNOSIS — J4541 Moderate persistent asthma with (acute) exacerbation: Secondary | ICD-10-CM | POA: Diagnosis not present

## 2021-05-25 DIAGNOSIS — Z23 Encounter for immunization: Secondary | ICD-10-CM | POA: Diagnosis not present

## 2021-05-25 DIAGNOSIS — F411 Generalized anxiety disorder: Secondary | ICD-10-CM | POA: Diagnosis not present

## 2021-05-25 DIAGNOSIS — M5116 Intervertebral disc disorders with radiculopathy, lumbar region: Secondary | ICD-10-CM | POA: Diagnosis not present

## 2021-05-29 DIAGNOSIS — M545 Low back pain, unspecified: Secondary | ICD-10-CM | POA: Diagnosis not present

## 2021-06-07 DIAGNOSIS — M5416 Radiculopathy, lumbar region: Secondary | ICD-10-CM | POA: Diagnosis not present

## 2021-06-07 DIAGNOSIS — M792 Neuralgia and neuritis, unspecified: Secondary | ICD-10-CM | POA: Diagnosis not present

## 2021-06-09 DIAGNOSIS — M545 Low back pain, unspecified: Secondary | ICD-10-CM | POA: Diagnosis not present

## 2021-06-15 DIAGNOSIS — M545 Low back pain, unspecified: Secondary | ICD-10-CM | POA: Diagnosis not present

## 2021-06-21 DIAGNOSIS — M545 Low back pain, unspecified: Secondary | ICD-10-CM | POA: Diagnosis not present

## 2021-06-26 DIAGNOSIS — M545 Low back pain, unspecified: Secondary | ICD-10-CM | POA: Diagnosis not present

## 2021-07-03 DIAGNOSIS — M5416 Radiculopathy, lumbar region: Secondary | ICD-10-CM | POA: Diagnosis not present

## 2021-07-10 DIAGNOSIS — M545 Low back pain, unspecified: Secondary | ICD-10-CM | POA: Diagnosis not present

## 2021-07-17 DIAGNOSIS — M545 Low back pain, unspecified: Secondary | ICD-10-CM | POA: Diagnosis not present

## 2021-07-21 DIAGNOSIS — M5416 Radiculopathy, lumbar region: Secondary | ICD-10-CM | POA: Diagnosis not present

## 2021-07-24 DIAGNOSIS — M545 Low back pain, unspecified: Secondary | ICD-10-CM | POA: Diagnosis not present

## 2021-07-31 DIAGNOSIS — M545 Low back pain, unspecified: Secondary | ICD-10-CM | POA: Diagnosis not present

## 2021-08-07 DIAGNOSIS — M545 Low back pain, unspecified: Secondary | ICD-10-CM | POA: Diagnosis not present

## 2021-08-16 DIAGNOSIS — M5416 Radiculopathy, lumbar region: Secondary | ICD-10-CM | POA: Diagnosis not present

## 2021-08-24 DIAGNOSIS — M545 Low back pain, unspecified: Secondary | ICD-10-CM | POA: Diagnosis not present

## 2021-08-28 DIAGNOSIS — Z23 Encounter for immunization: Secondary | ICD-10-CM | POA: Diagnosis not present

## 2021-08-28 DIAGNOSIS — E559 Vitamin D deficiency, unspecified: Secondary | ICD-10-CM | POA: Diagnosis not present

## 2021-08-28 DIAGNOSIS — J45909 Unspecified asthma, uncomplicated: Secondary | ICD-10-CM | POA: Diagnosis not present

## 2021-08-28 DIAGNOSIS — Z13220 Encounter for screening for lipoid disorders: Secondary | ICD-10-CM | POA: Diagnosis not present

## 2021-08-28 DIAGNOSIS — Z0000 Encounter for general adult medical examination without abnormal findings: Secondary | ICD-10-CM | POA: Diagnosis not present

## 2021-09-15 DIAGNOSIS — M5416 Radiculopathy, lumbar region: Secondary | ICD-10-CM | POA: Diagnosis not present

## 2021-09-15 DIAGNOSIS — M792 Neuralgia and neuritis, unspecified: Secondary | ICD-10-CM | POA: Diagnosis not present
